# Patient Record
Sex: Female | Born: 1935 | Race: White | Hispanic: No | Marital: Married | Smoking: Never smoker
Health system: Southern US, Community
[De-identification: ages and names within clinical notes are randomized; demographics above are authoritative.]

## PROBLEM LIST (undated history)

## (undated) DIAGNOSIS — R32 Unspecified urinary incontinence: Secondary | ICD-10-CM

## (undated) DIAGNOSIS — E7800 Pure hypercholesterolemia, unspecified: Secondary | ICD-10-CM

## (undated) DIAGNOSIS — Z5189 Encounter for other specified aftercare: Secondary | ICD-10-CM

## (undated) DIAGNOSIS — K589 Irritable bowel syndrome without diarrhea: Secondary | ICD-10-CM

## (undated) DIAGNOSIS — M549 Dorsalgia, unspecified: Secondary | ICD-10-CM

## (undated) DIAGNOSIS — F419 Anxiety disorder, unspecified: Secondary | ICD-10-CM

## (undated) DIAGNOSIS — J449 Chronic obstructive pulmonary disease, unspecified: Secondary | ICD-10-CM

## (undated) DIAGNOSIS — K219 Gastro-esophageal reflux disease without esophagitis: Secondary | ICD-10-CM

## (undated) DIAGNOSIS — T7840XA Allergy, unspecified, initial encounter: Secondary | ICD-10-CM

## (undated) HISTORY — DX: Allergy, unspecified, initial encounter: T78.40XA

## (undated) HISTORY — DX: Chronic obstructive pulmonary disease, unspecified: J44.9

## (undated) HISTORY — DX: Unspecified urinary incontinence: R32

## (undated) HISTORY — PX: BLADDER REPAIR: SHX76

## (undated) HISTORY — DX: Gastro-esophageal reflux disease without esophagitis: K21.9

## (undated) HISTORY — PX: BREAST EXCISIONAL BIOPSY: SUR124

## (undated) HISTORY — PX: CHOLECYSTECTOMY: SHX55

## (undated) HISTORY — DX: Pure hypercholesterolemia, unspecified: E78.00

## (undated) HISTORY — PX: ABDOMINAL HYSTERECTOMY: SUR658

## (undated) HISTORY — DX: Irritable bowel syndrome without diarrhea: K58.9

## (undated) HISTORY — DX: Anxiety disorder, unspecified: F41.9

## (undated) HISTORY — DX: Encounter for other specified aftercare: Z51.89

## (undated) HISTORY — DX: Dorsalgia, unspecified: M54.9

---

## 1997-11-17 ENCOUNTER — Ambulatory Visit (HOSPITAL_COMMUNITY): Admission: RE | Admit: 1997-11-17 | Discharge: 1997-11-17 | Payer: Self-pay | Admitting: Gastroenterology

## 1998-01-03 ENCOUNTER — Ambulatory Visit (HOSPITAL_COMMUNITY): Admission: RE | Admit: 1998-01-03 | Discharge: 1998-01-03 | Payer: Self-pay | Admitting: Obstetrics & Gynecology

## 1998-06-21 ENCOUNTER — Observation Stay (HOSPITAL_COMMUNITY): Admission: RE | Admit: 1998-06-21 | Discharge: 1998-06-24 | Payer: Self-pay | Admitting: Urology

## 2000-01-16 ENCOUNTER — Encounter: Admission: RE | Admit: 2000-01-16 | Discharge: 2000-01-16 | Payer: Self-pay | Admitting: Geriatric Medicine

## 2000-01-16 ENCOUNTER — Encounter: Payer: Self-pay | Admitting: Geriatric Medicine

## 2000-02-12 ENCOUNTER — Encounter: Payer: Self-pay | Admitting: Geriatric Medicine

## 2000-02-12 ENCOUNTER — Encounter: Admission: RE | Admit: 2000-02-12 | Discharge: 2000-02-12 | Payer: Self-pay | Admitting: Geriatric Medicine

## 2000-02-18 ENCOUNTER — Encounter (INDEPENDENT_AMBULATORY_CARE_PROVIDER_SITE_OTHER): Payer: Self-pay | Admitting: *Deleted

## 2000-02-18 ENCOUNTER — Ambulatory Visit (HOSPITAL_BASED_OUTPATIENT_CLINIC_OR_DEPARTMENT_OTHER): Admission: RE | Admit: 2000-02-18 | Discharge: 2000-02-18 | Payer: Self-pay | Admitting: *Deleted

## 2001-11-26 ENCOUNTER — Encounter: Payer: Self-pay | Admitting: Geriatric Medicine

## 2001-11-26 ENCOUNTER — Encounter: Admission: RE | Admit: 2001-11-26 | Discharge: 2001-11-26 | Payer: Self-pay | Admitting: Geriatric Medicine

## 2002-06-13 ENCOUNTER — Other Ambulatory Visit: Admission: RE | Admit: 2002-06-13 | Discharge: 2002-06-13 | Payer: Self-pay | Admitting: Geriatric Medicine

## 2002-07-14 ENCOUNTER — Observation Stay (HOSPITAL_COMMUNITY): Admission: RE | Admit: 2002-07-14 | Discharge: 2002-07-15 | Payer: Self-pay | Admitting: Urology

## 2002-07-14 ENCOUNTER — Encounter: Payer: Self-pay | Admitting: Urology

## 2002-11-17 ENCOUNTER — Ambulatory Visit (HOSPITAL_BASED_OUTPATIENT_CLINIC_OR_DEPARTMENT_OTHER): Admission: RE | Admit: 2002-11-17 | Discharge: 2002-11-17 | Payer: Self-pay | Admitting: Urology

## 2002-12-06 ENCOUNTER — Encounter: Admission: RE | Admit: 2002-12-06 | Discharge: 2002-12-06 | Payer: Self-pay | Admitting: Geriatric Medicine

## 2002-12-06 ENCOUNTER — Encounter: Payer: Self-pay | Admitting: Geriatric Medicine

## 2003-12-07 ENCOUNTER — Encounter: Admission: RE | Admit: 2003-12-07 | Discharge: 2003-12-07 | Payer: Self-pay | Admitting: Geriatric Medicine

## 2003-12-14 ENCOUNTER — Encounter: Admission: RE | Admit: 2003-12-14 | Discharge: 2003-12-14 | Payer: Self-pay | Admitting: Geriatric Medicine

## 2004-05-27 ENCOUNTER — Ambulatory Visit (HOSPITAL_BASED_OUTPATIENT_CLINIC_OR_DEPARTMENT_OTHER): Admission: RE | Admit: 2004-05-27 | Discharge: 2004-05-27 | Payer: Self-pay | Admitting: Urology

## 2005-02-04 ENCOUNTER — Encounter: Admission: RE | Admit: 2005-02-04 | Discharge: 2005-02-04 | Payer: Self-pay

## 2005-02-18 ENCOUNTER — Encounter: Admission: RE | Admit: 2005-02-18 | Discharge: 2005-02-18 | Payer: Self-pay

## 2006-02-05 ENCOUNTER — Encounter: Admission: RE | Admit: 2006-02-05 | Discharge: 2006-02-05 | Payer: Self-pay | Admitting: Family Medicine

## 2006-02-05 IMAGING — MG MM SCREEN MAMMOGRAM BILATERAL
5 series · 5 of 5 positions shown · non-contrast
Comparison: none

DG SCREEN MAMMOGRAM BILATERAL
Bilateral CC and MLO view(s) were taken.

DIGITAL SCREENING MAMMOGRAM WITH CAD:
There is a fibroglandular pattern.  A possible mass is noted in the left breast.  Spot compression 
views and possibly sonography are recommended for further evaluation.  In the right breast, no 
masses or malignant type calcifications are identified.  Compared with prior studies.

[R CC]
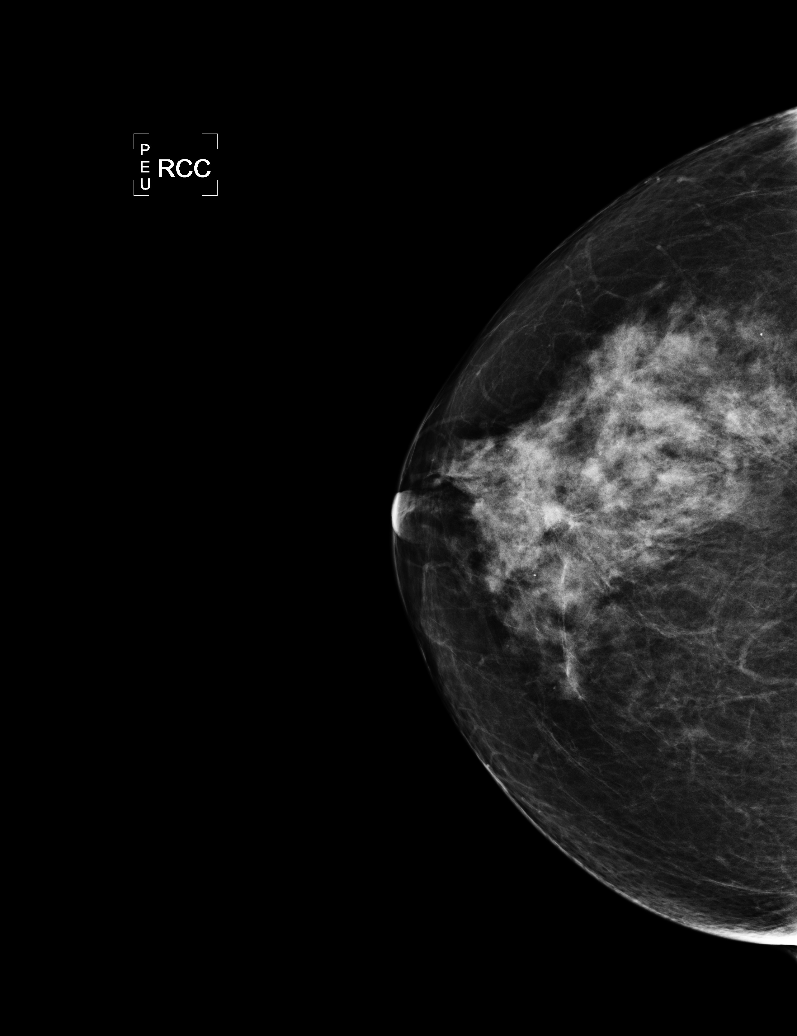

[L CC]
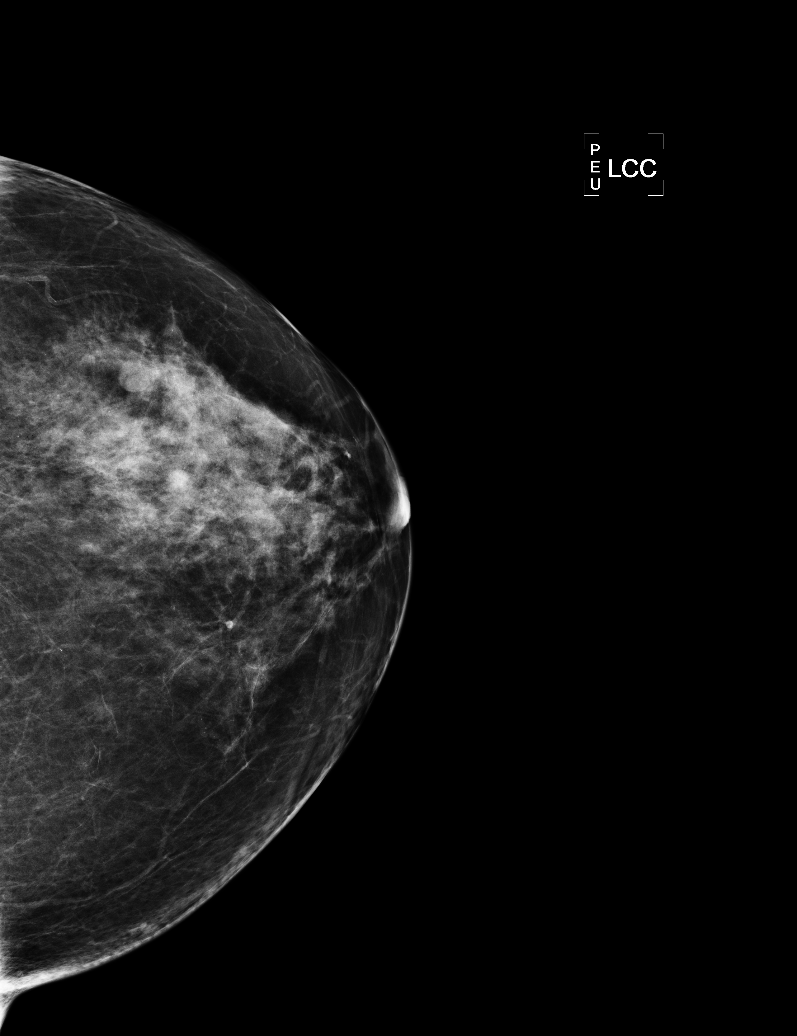

[L MLO]
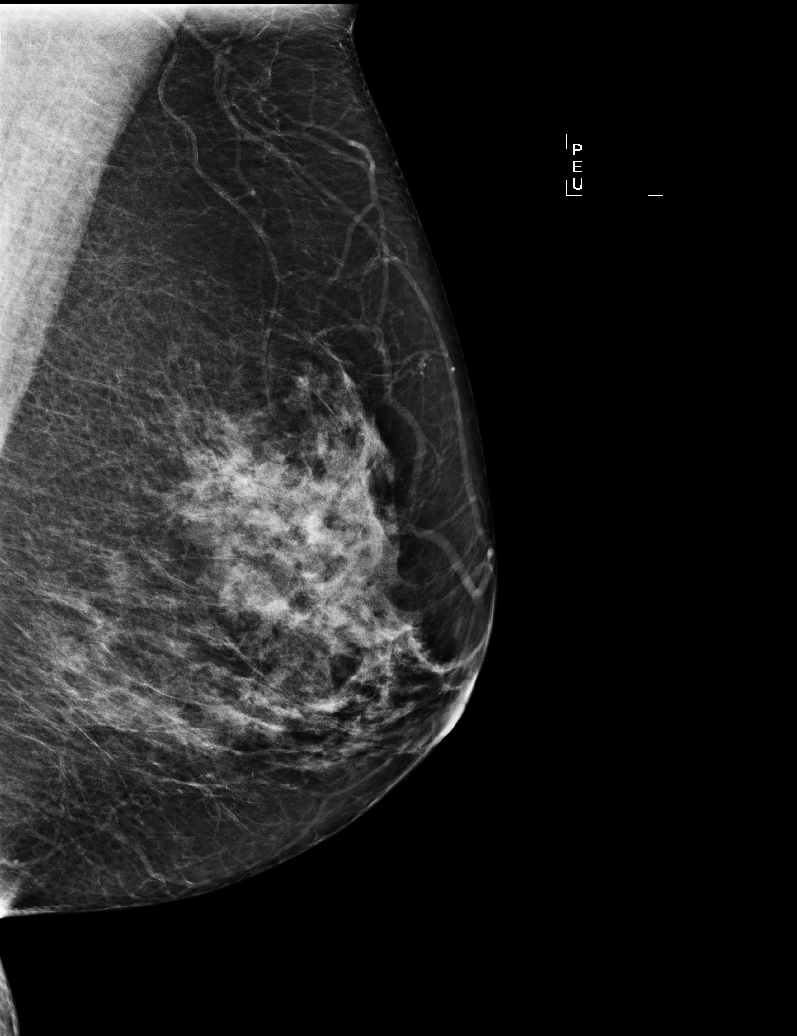

[R MLO]
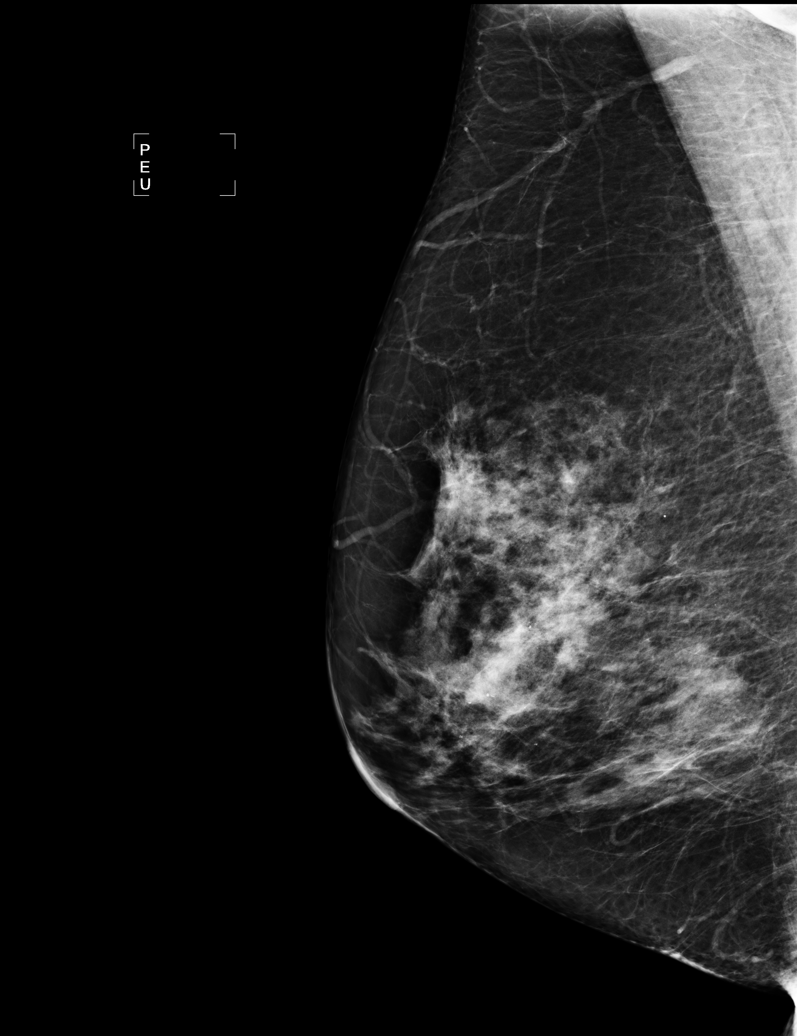

[L XCCL]
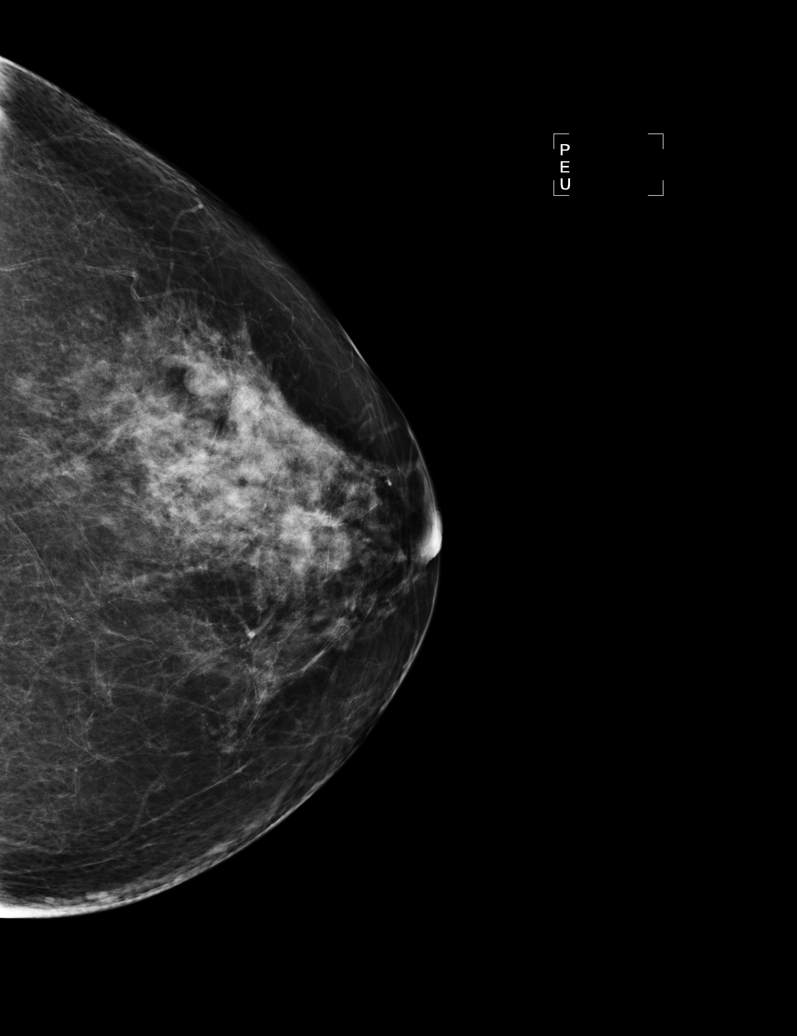

[5 of 5 positions shown; findings below may reference images not displayed]

IMPRESSION: Possible mass, left breast.  Additional evaluation is indicated.  The patient will be contacted for
additional studies and a supplementary report will follow.  No specific mammographic evidence of 
malignancy, right breast.

ASSESSMENT: Need additional imaging evaluation and/or prior mammograms for comparison - BI-RADS 0

Further imaging of the left breast.
ANALYZED BY COMPUTER AIDED DETECTION. , THIS PROCEDURE WAS A DIGITAL MAMMOGRAM.

## 2006-02-16 ENCOUNTER — Encounter: Admission: RE | Admit: 2006-02-16 | Discharge: 2006-02-16 | Payer: Self-pay | Admitting: Family Medicine

## 2006-02-16 IMAGING — US UNKNOWN PR STUDY
1 series · 3 of 3 positions shown · non-contrast
Comparison: none

[REDACTED] LEFT
CC and MLO view(s) were taken of the left breast.

LEFT BREAST ULTRASOUND
DIGITAL LIMITED LEFT DIAGNOSTIC MAMMOGRAM AND LEFT BREAST ULTRASOUND:
CLINICAL DATA: Abnormal screening mammogram.

[Series 1: unknown pr study · 3 of 3 slices shown]
[im 1/3]
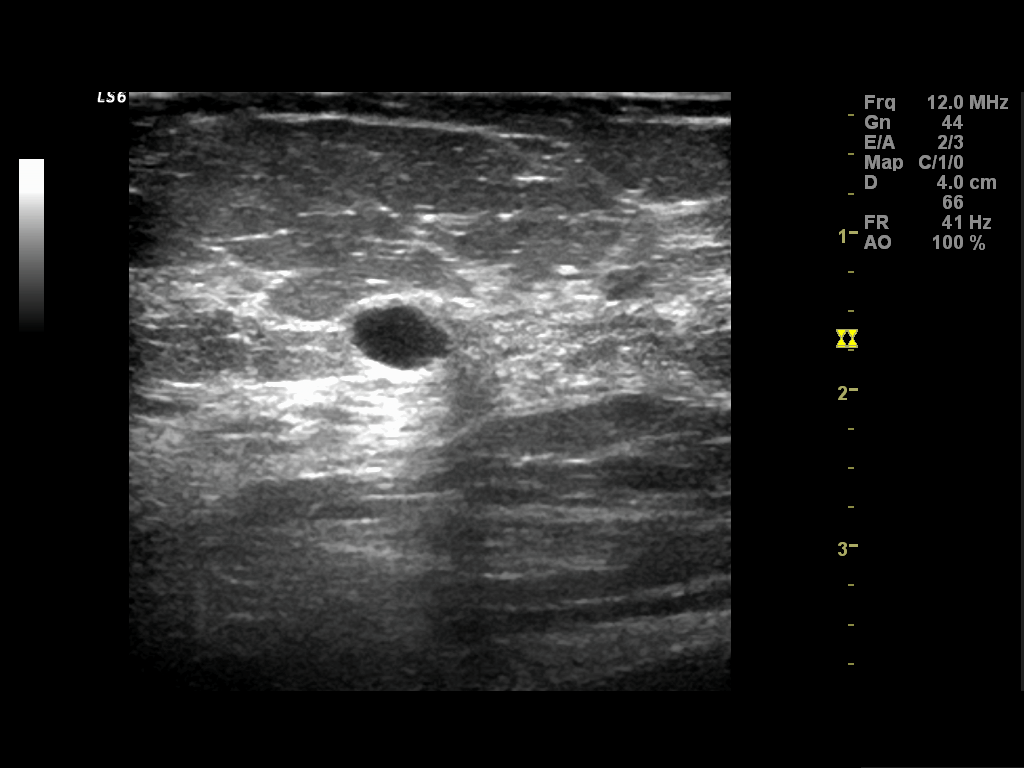
[im 2/3]
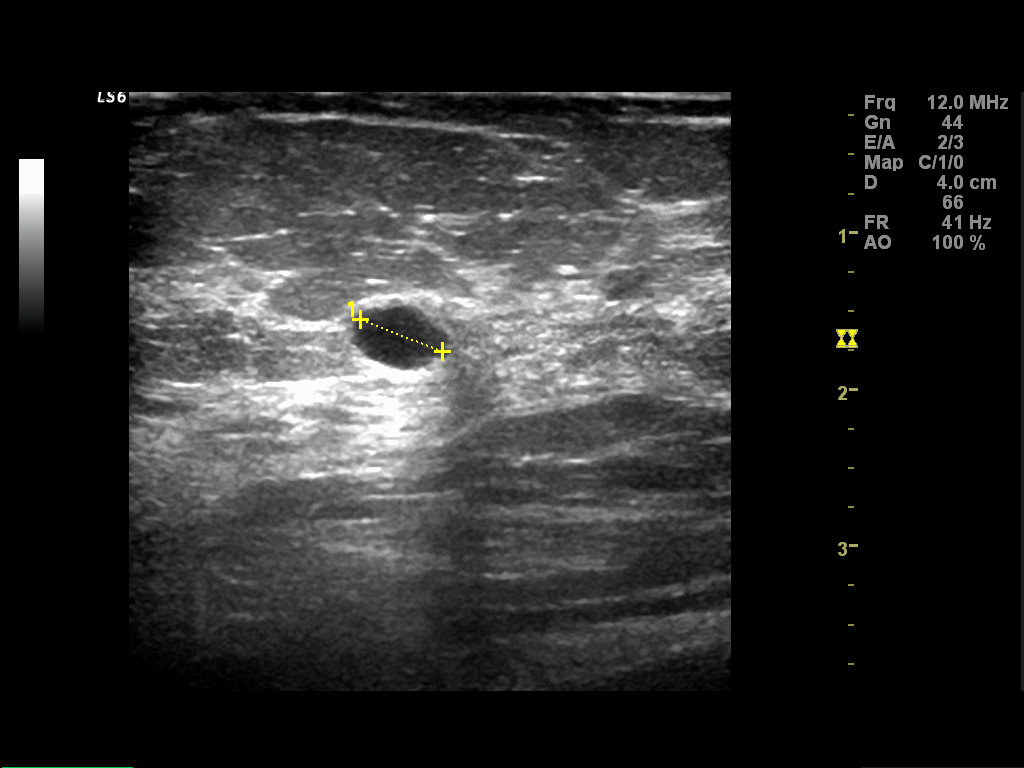
[im 3/3]
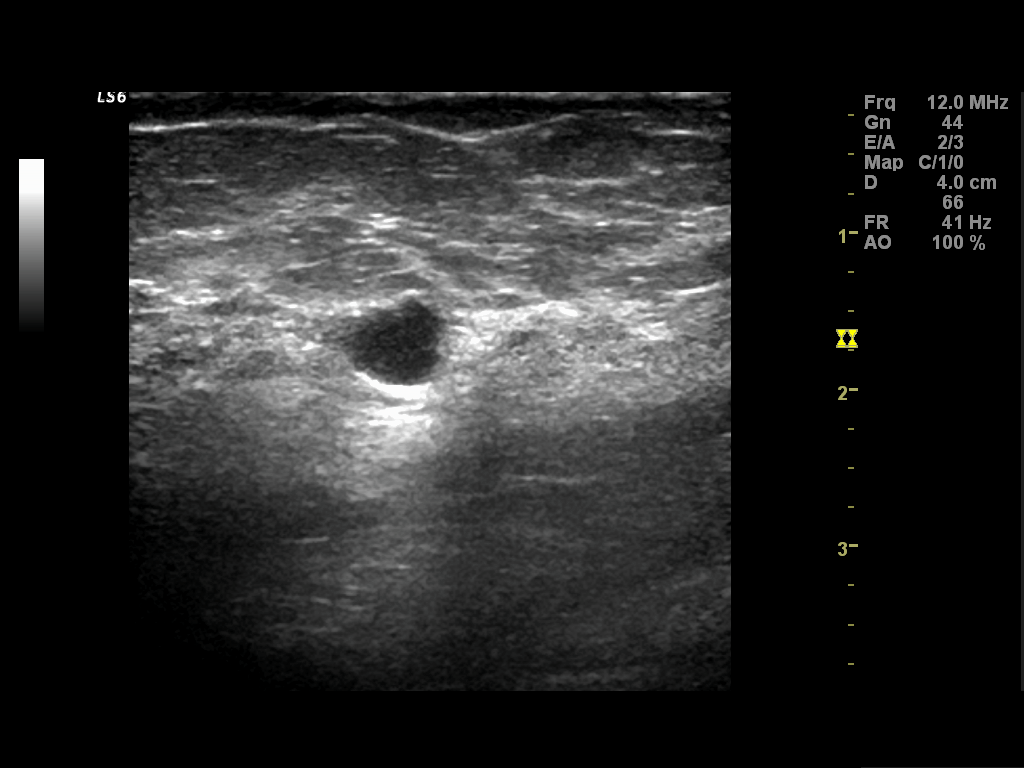

[3 of 3 positions shown; findings below may reference images not displayed]

Comparison study is [DATE].  Additional views reveal persistence of a partially obscured isodense 
mass within the left upper outer quadrant.

Physical exam of the left breast is normal.  Ultrasound reveals a 6 mm simple cyst at the 2 o'clock
position, 3 cm from the nipple which correlates with the area of mammographic concern.  No 
suspicious solid masses or shadowing are seen.
IMPRESSION: Simple cyst in the left upper outer quadrant.  There is no specific radiographic evidence of 
malignancy on the left.  Screening mammogram in one year is recommended.

ASSESSMENT: Benign - BI-RADS 2

Screening mammogram in 1 year.
,

## 2007-04-13 ENCOUNTER — Encounter: Admission: RE | Admit: 2007-04-13 | Discharge: 2007-04-13 | Payer: Self-pay | Admitting: Family Medicine

## 2007-04-13 IMAGING — MG MM SCREEN MAMMOGRAM BILATERAL
5 series · 5 of 5 positions shown · non-contrast
Comparison: Prior studies.

DG SCREEN MAMMOGRAM BILATERAL
Bilateral CC and MLO view(s) were taken.
Prior study comparison: [DATE], bilateral screening mammogram.

DIGITAL SCREENING MAMMOGRAM WITH CAD:

[R CC]
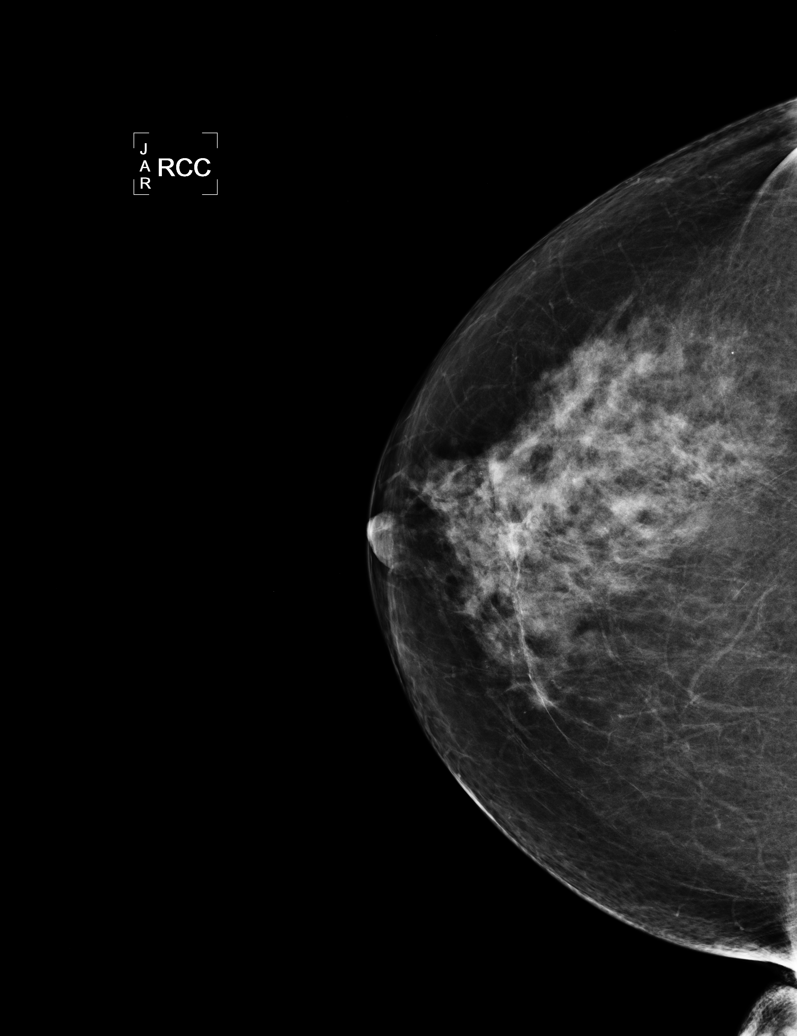

[L CC]
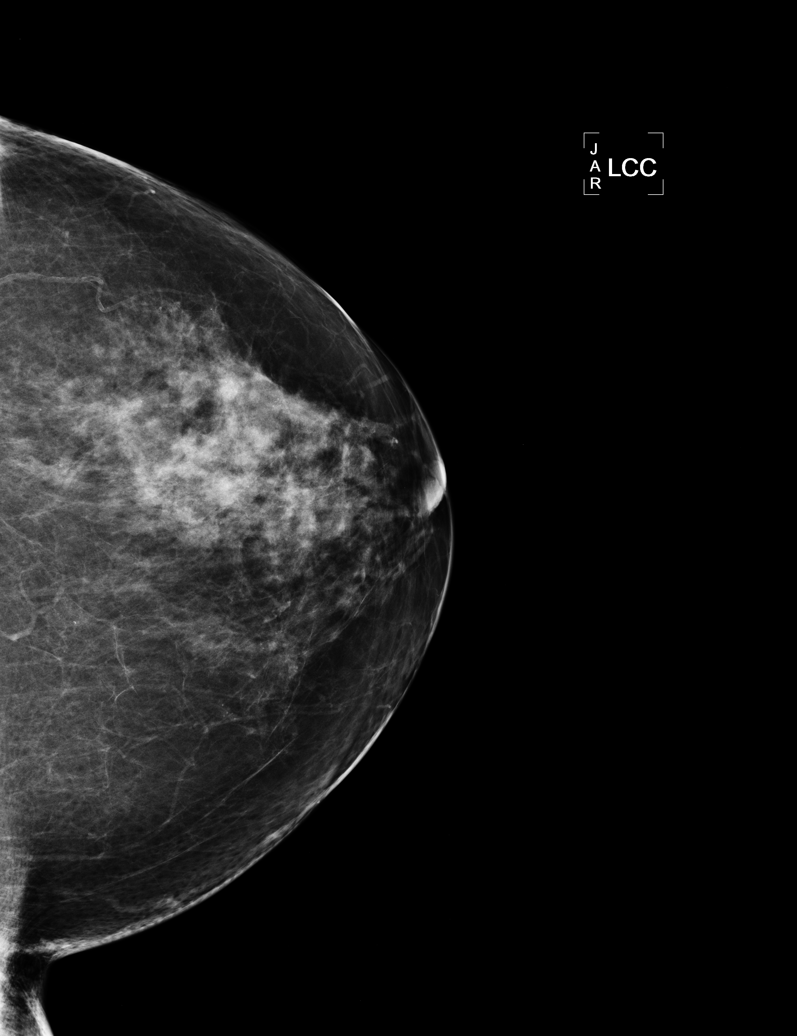

[L MLO (1 of 2)]
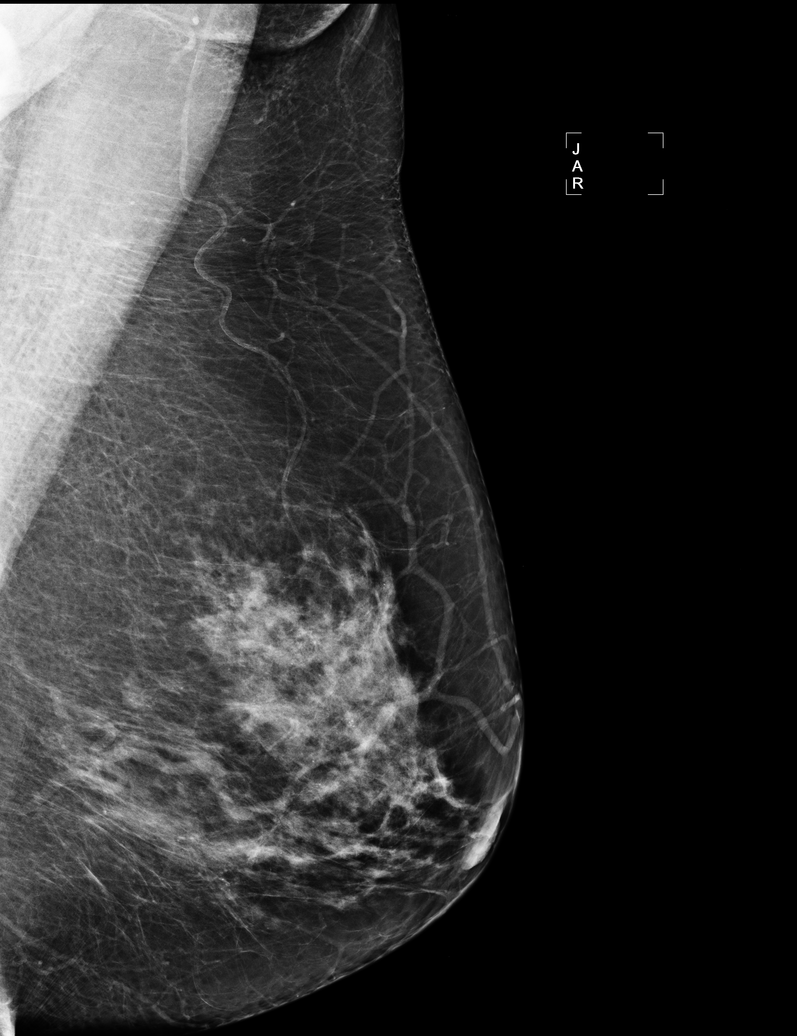

[R MLO]
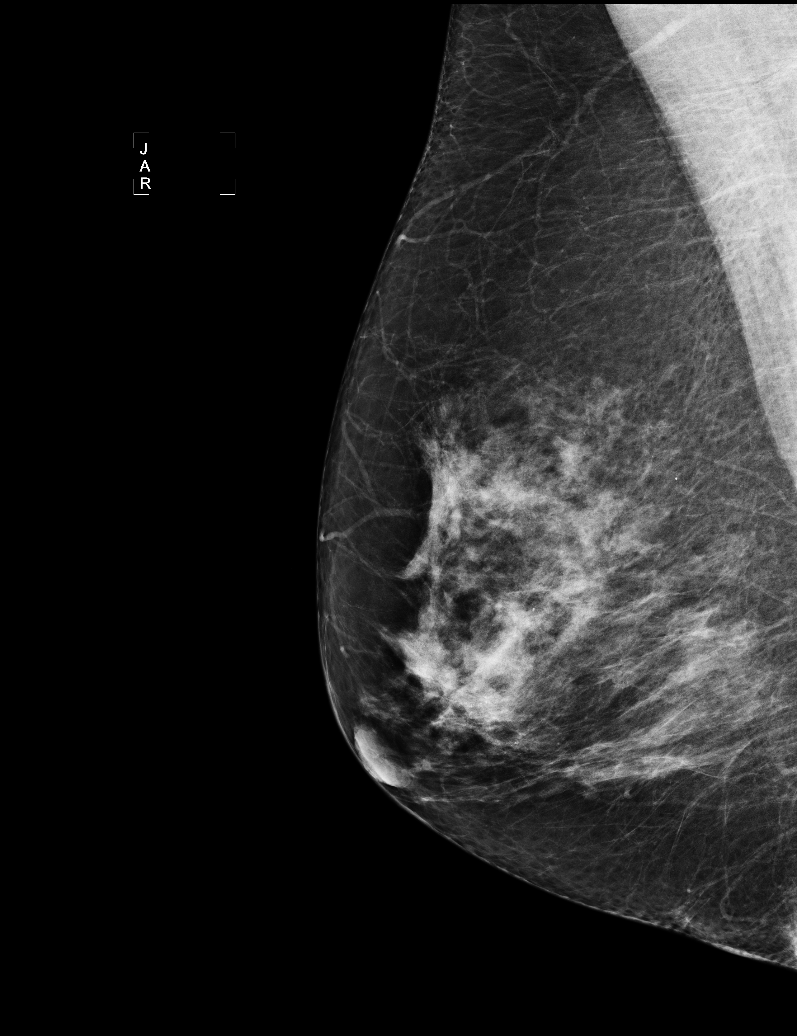

[L MLO (2 of 2)]
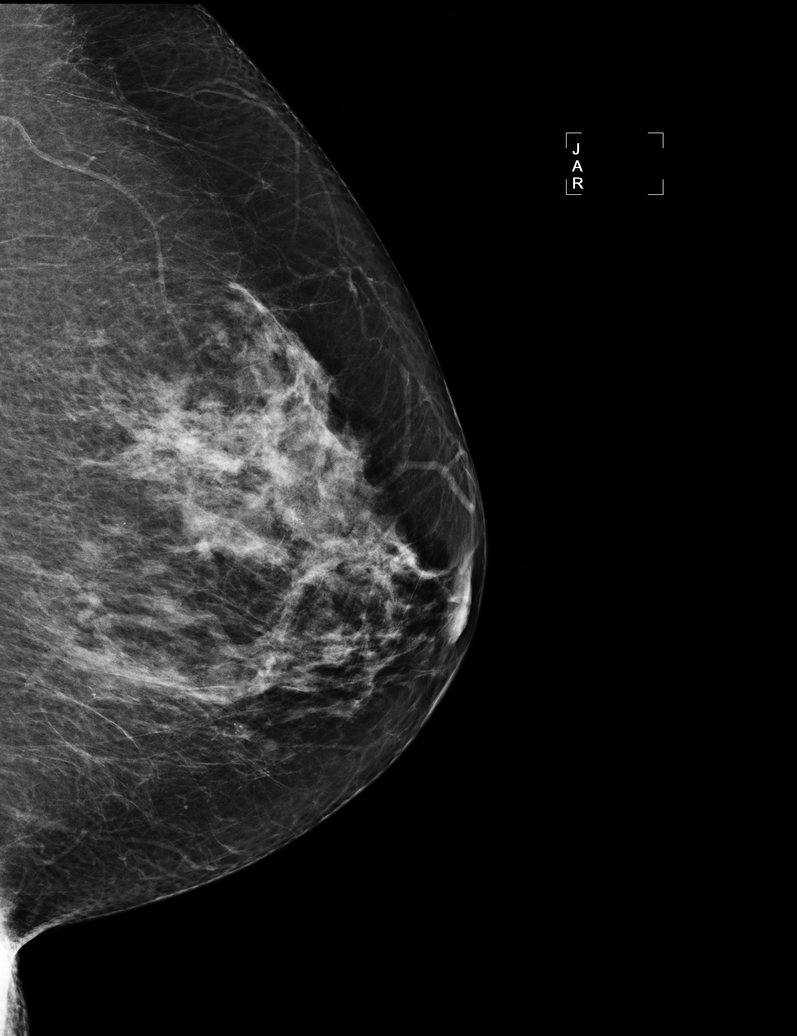

[5 of 5 positions shown; findings below may reference images not displayed]

The breast tissue is heterogeneously dense.  There is no dominant mass, architectural distortion or
calcification to suggest malignancy.
IMPRESSION: No mammographic evidence of malignancy.  Suggest yearly screening mammography.

ASSESSMENT: Negative - BI-RADS 1

Screening mammogram in 1 year.
ANALYZED BY COMPUTER AIDED DETECTION. , THIS PROCEDURE WAS A DIGITAL MAMMOGRAM.

## 2008-05-02 ENCOUNTER — Encounter: Admission: RE | Admit: 2008-05-02 | Discharge: 2008-05-02 | Payer: Self-pay | Admitting: Family Medicine

## 2008-05-02 IMAGING — MG MM SCREEN MAMMOGRAM BILATERAL
4 series · 4 of 4 positions shown · non-contrast
Comparison: none

DG SCREEN MAMMOGRAM BILATERAL
Bilateral CC and MLO view(s) were taken.

DIGITAL SCREENING MAMMOGRAM WITH CAD:
The breast tissue is extremely dense.  No masses or malignant type calcifications are identified.  
Compared with prior studies.

[R CC]
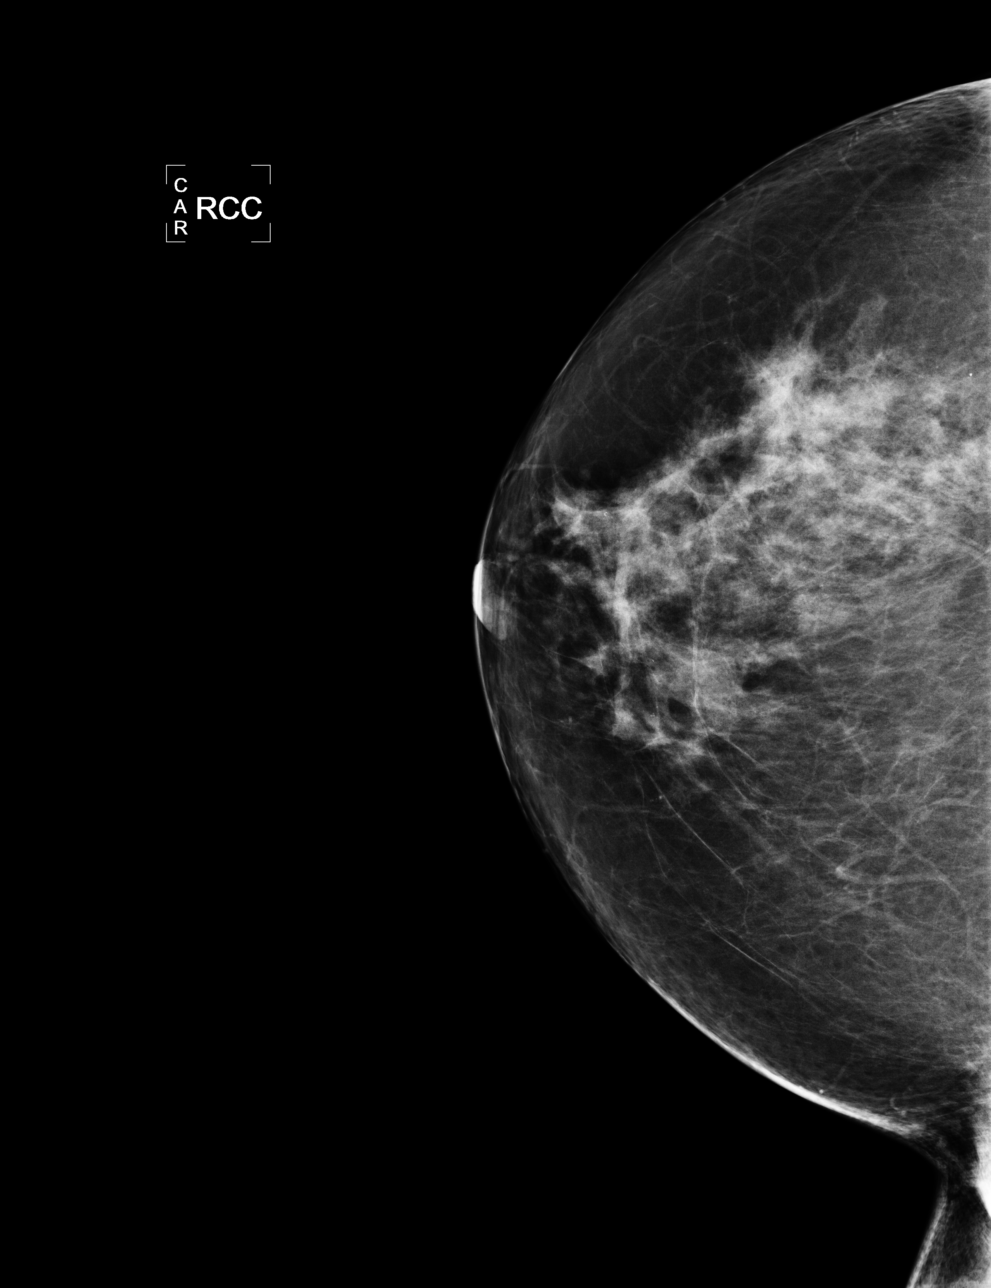

[L CC]
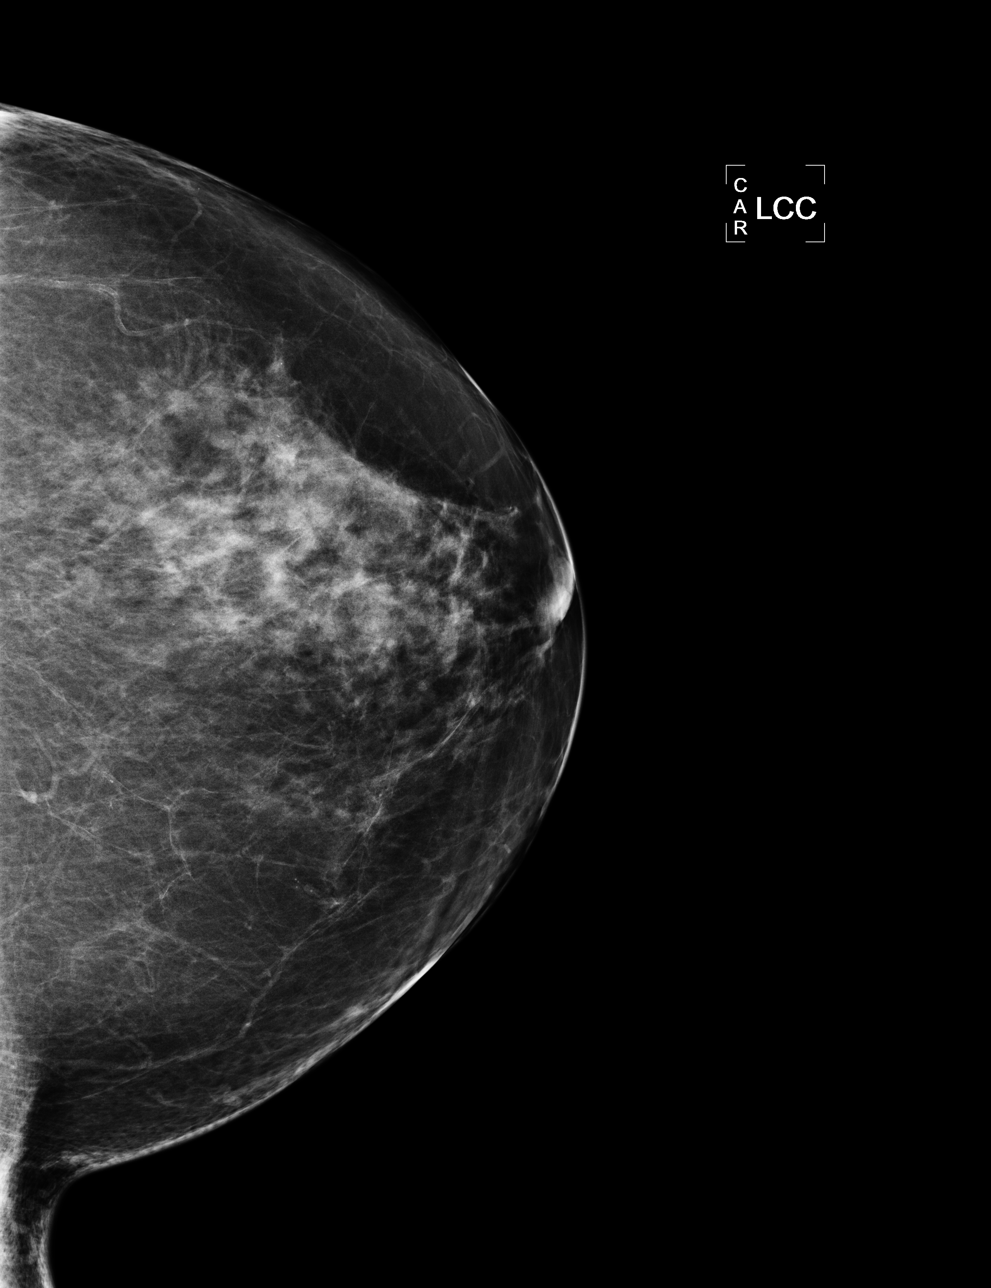

[L MLO]
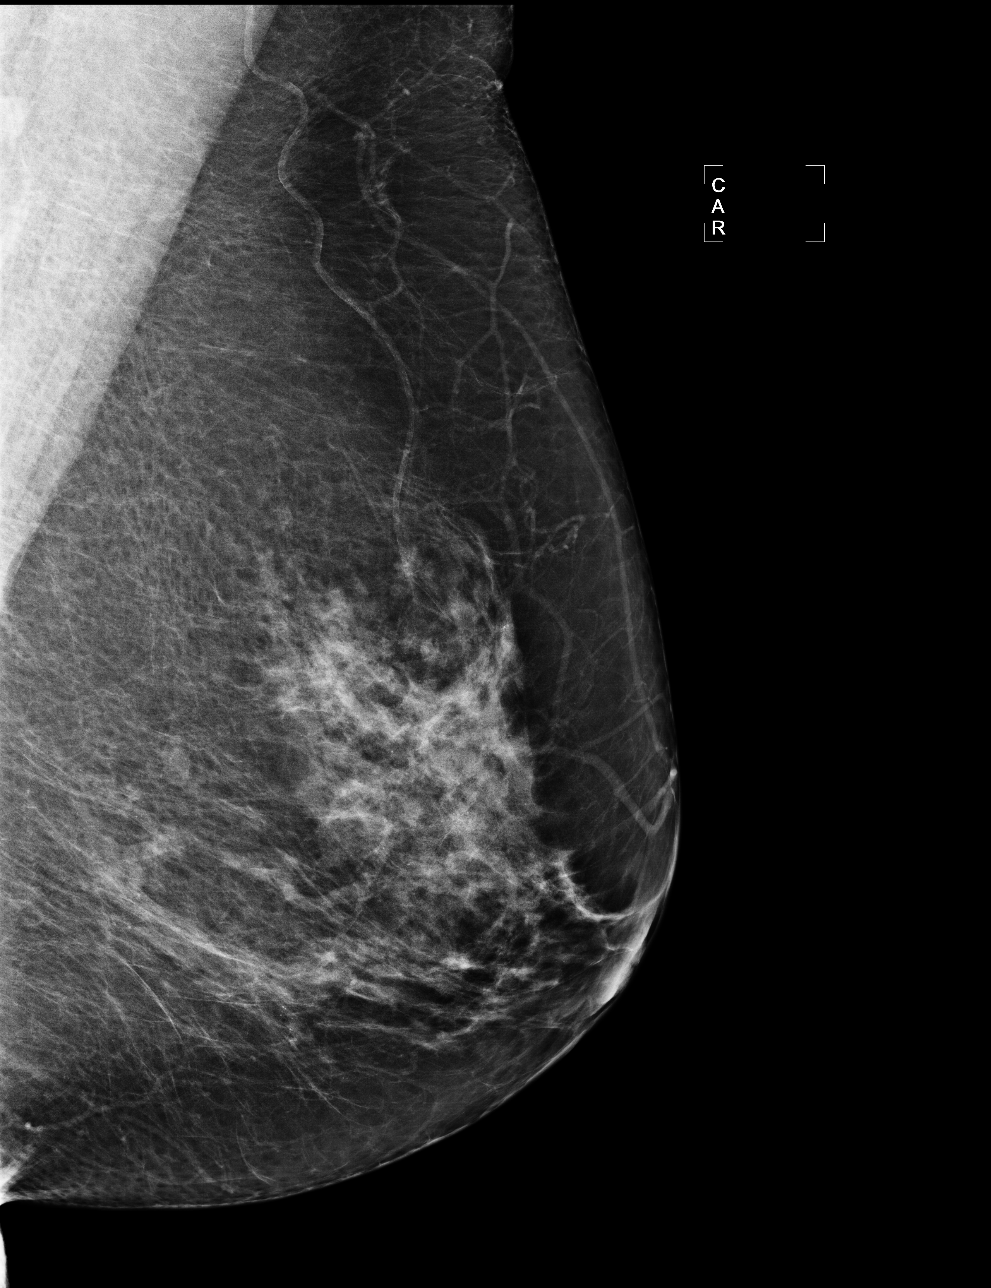

[R MLO]
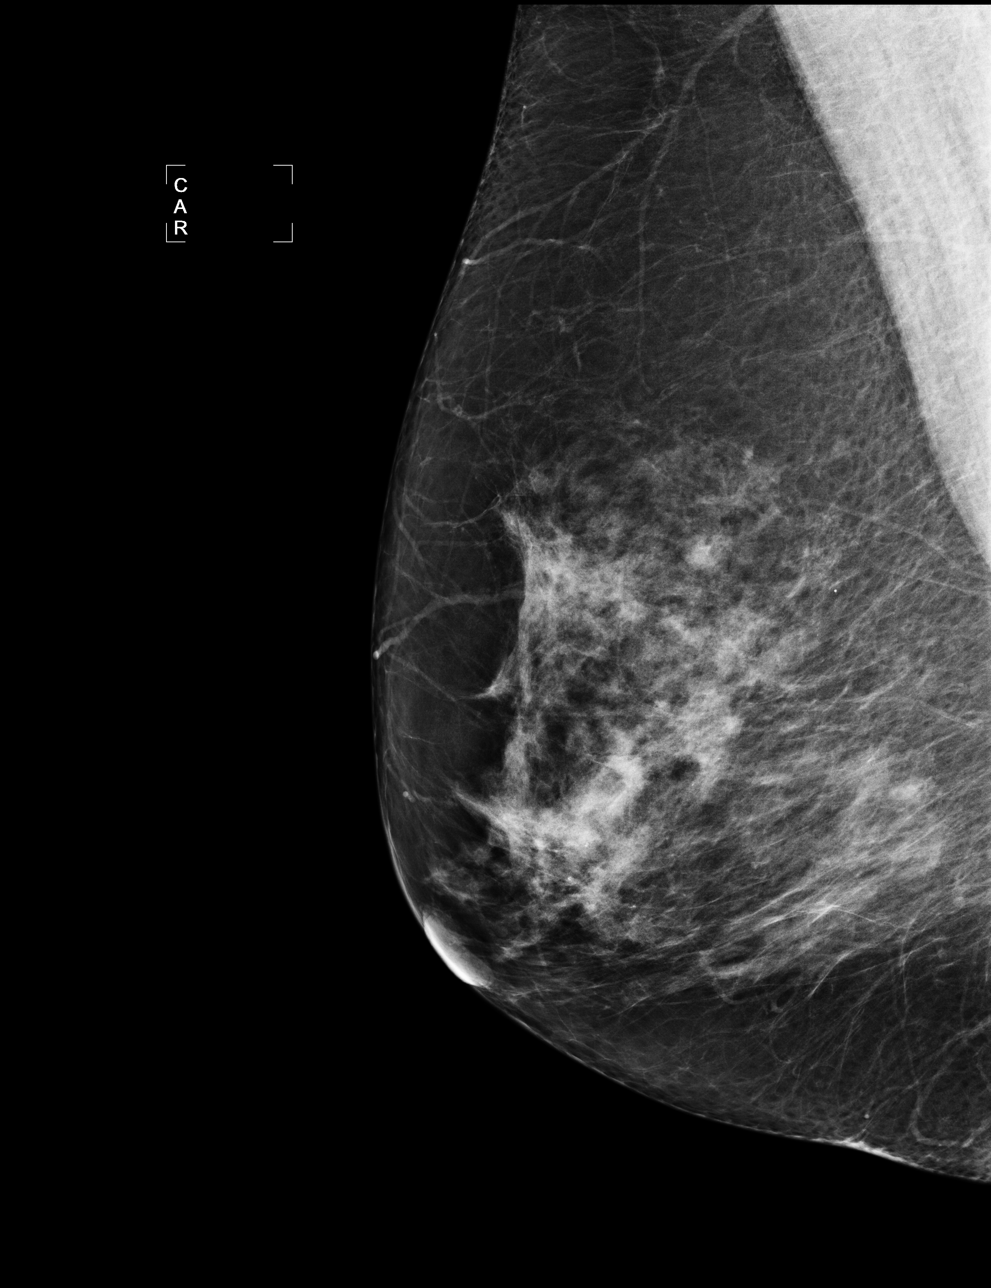

[4 of 4 positions shown; findings below may reference images not displayed]

IMPRESSION: No specific mammographic evidence of malignancy.  Next screening mammogram is recommended in one 
year.

ASSESSMENT: Negative - BI-RADS 1

Screening mammogram in 1 year.
ANALYZED BY COMPUTER AIDED DETECTION. , THIS PROCEDURE WAS A DIGITAL MAMMOGRAM.

## 2009-05-09 ENCOUNTER — Encounter: Admission: RE | Admit: 2009-05-09 | Discharge: 2009-05-09 | Payer: Self-pay | Admitting: Family Medicine

## 2009-05-09 IMAGING — MG MM DIGITAL SCREENING
4 series · 4 of 4 positions shown · non-contrast
Comparison: Prior studies.

DG SCREEN MAMMOGRAM BILATERAL
Bilateral CC and MLO view(s) were taken.

DIGITAL SCREENING MAMMOGRAM WITH CAD:

[R CC]
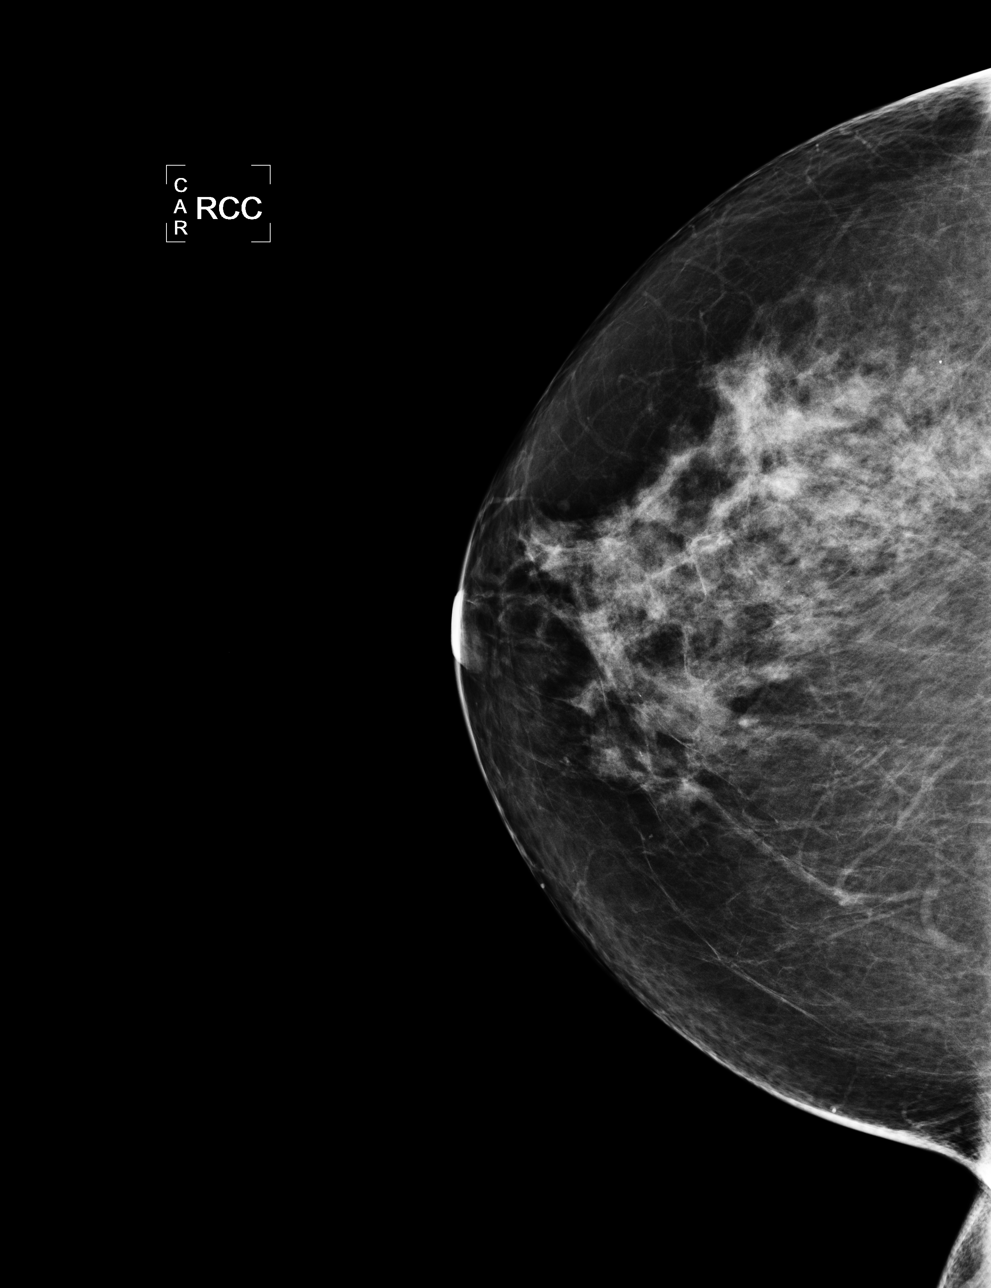

[L CC]
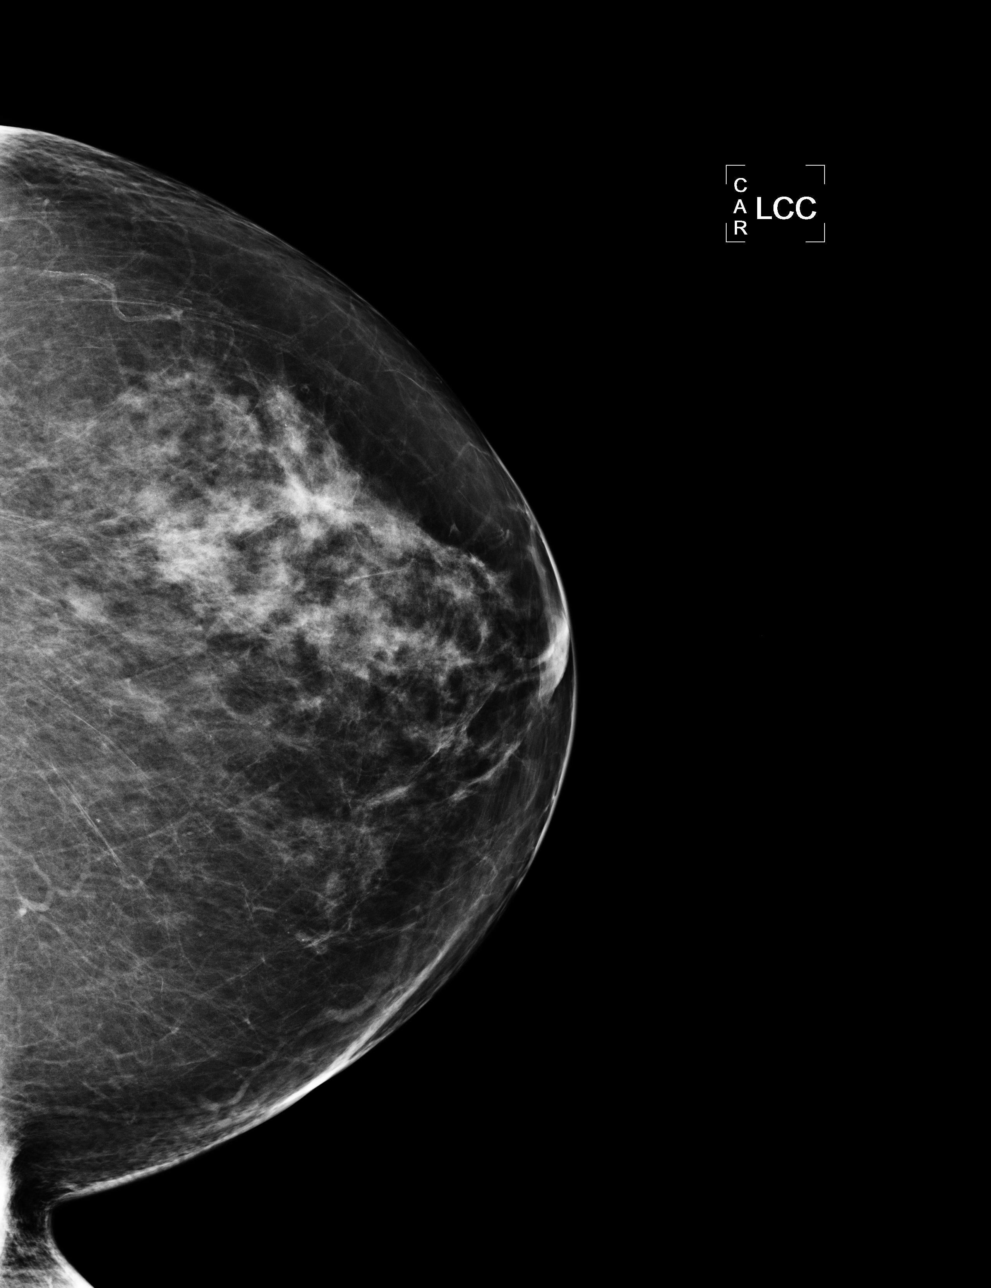

[L MLO]
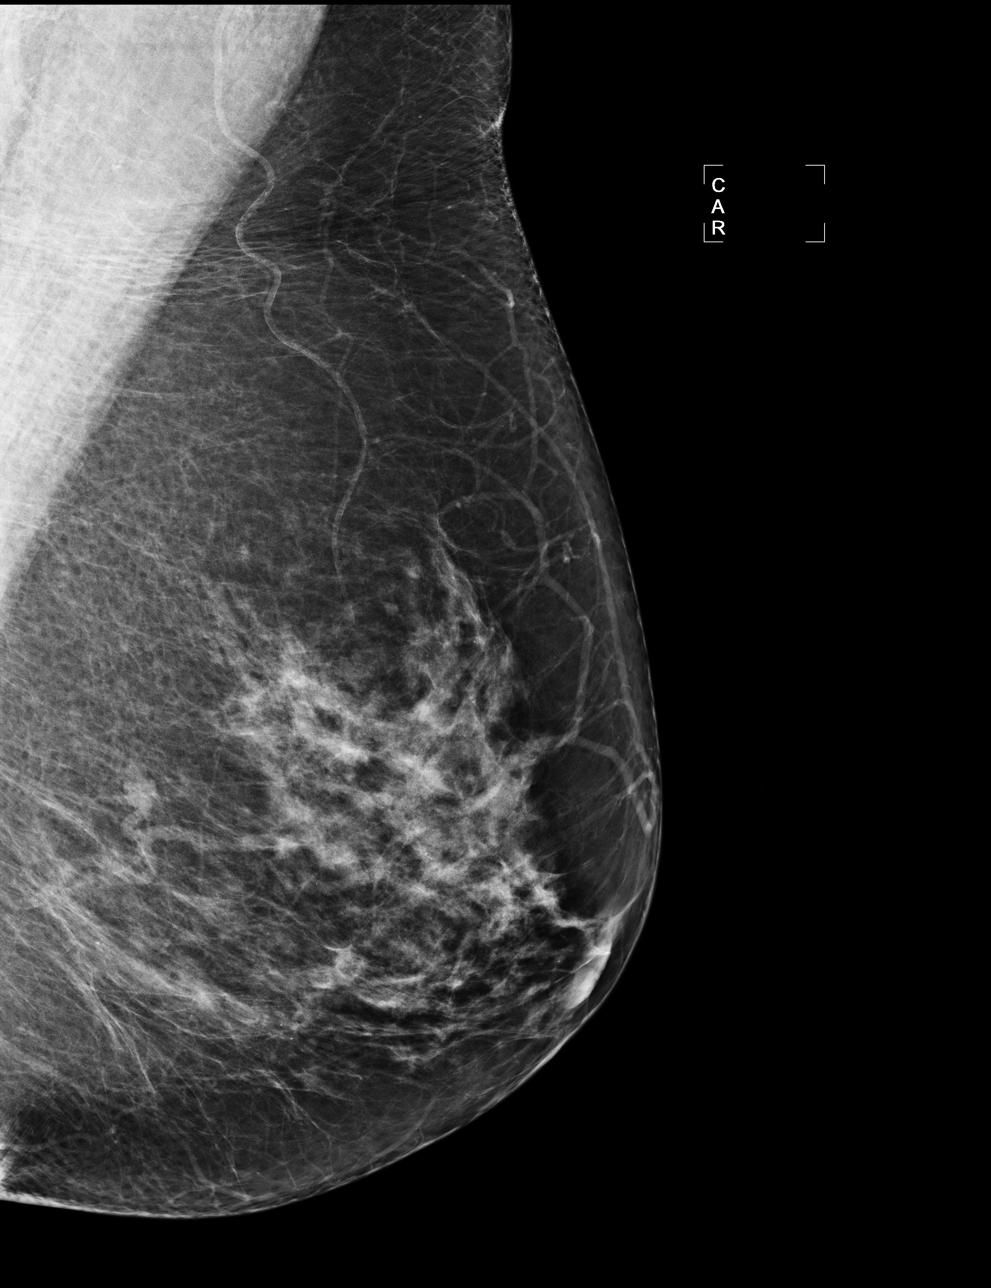

[R MLO]
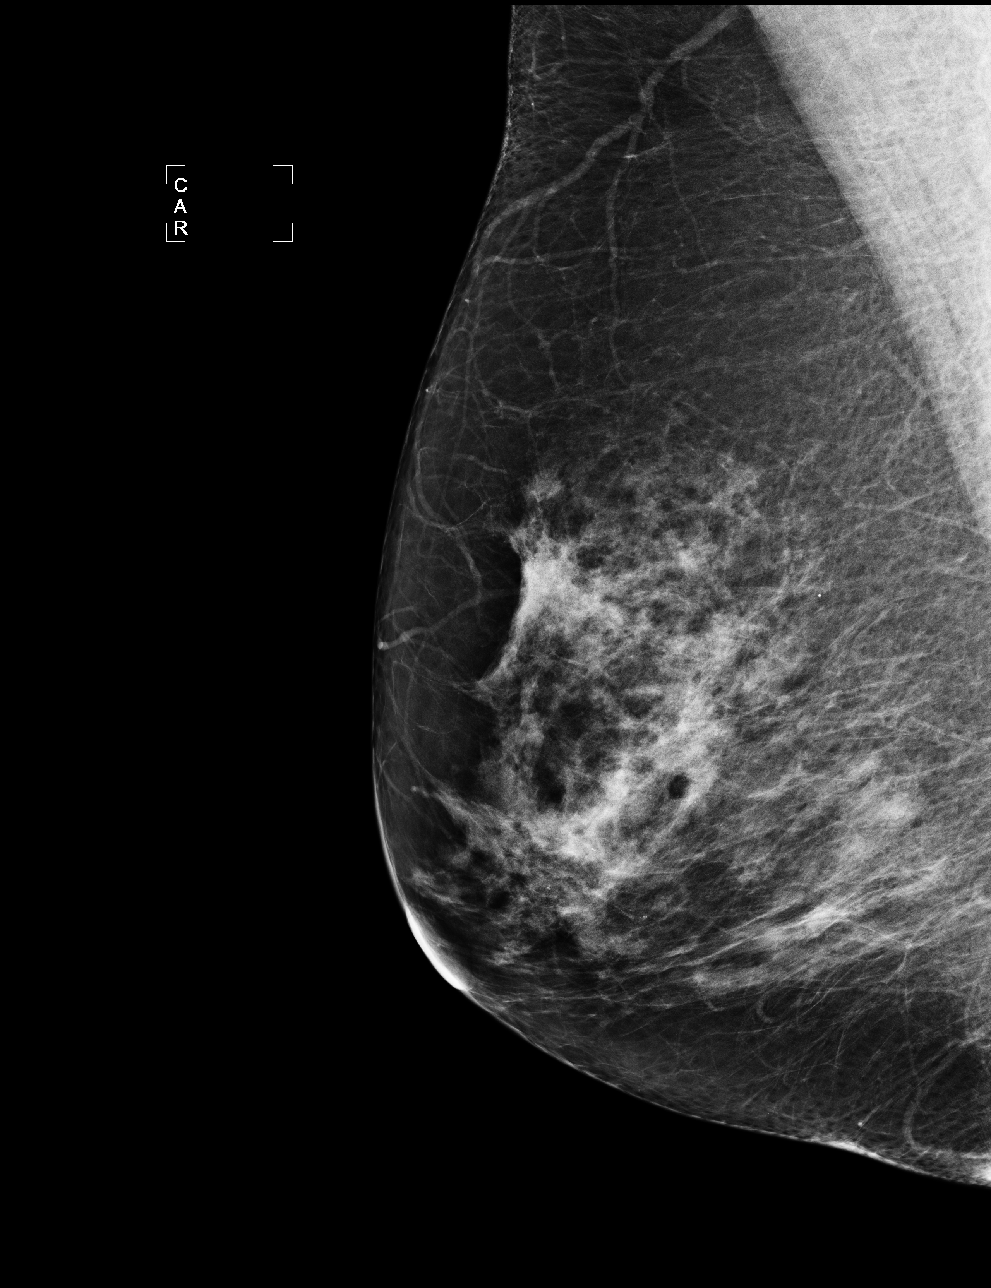

[4 of 4 positions shown; findings below may reference images not displayed]

There are scattered fibroglandular densities.  There is no dominant mass, architectural distortion 
or calcification to suggest malignancy.

Images were processed with CAD.
IMPRESSION: No mammographic evidence of malignancy.  Suggest yearly screening mammography.

A result letter of this screening mammogram will be mailed directly to the patient.

ASSESSMENT: Negative - BI-RADS 1

Screening mammogram in 1 year.
,

## 2010-05-12 ENCOUNTER — Encounter: Payer: Self-pay | Admitting: Geriatric Medicine

## 2010-09-06 NOTE — Op Note (Signed)
NAME:  [name], Tina            ACCOUNT NO.:  [id number]   MEDICAL RECORD NO.:  [id number]          PATIENT TYPE:  AMB   LOCATION:  [facility]                         FACILITY:  [facility]   PHYSICIAN:  Sigmund I. Patsi Sears, M.D.DATE OF BIRTH:  Sep 18, 1934   DATE OF PROCEDURE:  05/27/2004  DATE OF DISCHARGE:                                 OPERATIVE REPORT   PREOPERATIVE DIAGNOSIS:  Vaginal extrusion of Mentor OB anti-incontinence  pubovaginal sling tape.   POSTOPERATIVE DIAGNOSIS:  Vaginal extrusion of Mentor OB anti-incontinence  pubovaginal sling tape.   PROCEDURE:  Transvaginal repair of sling extrusion.   SURGEON:  Sigmund I. Patsi Sears, M.D.   ANESTHESIA:  General LMA.   PREPARATION:  After appropriate preanesthesia, the patient was brought to  the operating room and placed on the operating table in the dorsal supine  position where general LMA anesthesia was introduced.  She was then replaced  in the dorsal lithotomy position.  She was given a B&O suppository.  The  pubis was prepped with Betadine solution and draped in the usual fashion.   INDICATIONS FOR PROCEDURE:  This 76 year old female is status post Mentor  transobturator OB tape sling for urinary incontinence in March of 2004.  The  patient had a mild extrusion which was repaired, but she has now returned  with a deeper right-sided extrusion and is for re-repair.   DESCRIPTION OF PROCEDURE:  Outline of area of extrusion was made in the  right paraurethral tissue.  A U-shaped incision was then made in the vagina  in the anterior vaginal wall which was noted to have a grade 1 cystocele.  The subcutaneous tissue was dissected, and a rectangular piece of  transvaginal tissue was removed, including a portion of extruded pubovaginal  sling.  A mucosal biofilm was identified, and this was teased and dissected  from the tissue.  Multiple irrigations with antibiotic irrigation was  accomplished.  The area was then clean, with  mild bleeding.  Because of the  thinness of the vaginal tissue, it was elected to place a piece of PelviSoft  in the wound.  Therefore, a piece of PelviSoft was soaked in antibiotic  solution and contoured to fit the wound.  Four separate sutures were then  used to hold the PelviSoft in place in each corner.  Following placement of  the PelviSoft, the vaginal wound was closed with interrupted 3-0 Vicryl  sutures, with excellent coverage and excellent closure.  Because of piece of  PelviSoft was used, it was elected to place packing and Foley catheter in  order to push the  PelviSoft up and have it lay flat against the bladder wall.  Cystoscopy was  then accomplished and showed that the bladder was in excellent shape.  It  was elected to leave the Foley catheter and packing in place for 24 hours.   The patient was awakened and taken to the recovery room in good condition.      SIT/MEDQ  D:  05/27/2004  T:  05/27/2004  Job:  [id number]

## 2010-09-06 NOTE — Op Note (Signed)
   NAME:  Tina [name], Tina [name]                      ACCOUNT NO.:  [id number]   MEDICAL RECORD NO.:  [id number]                   PATIENT TYPE:  AMB   LOCATION:  DAY                                  FACILITY:  [facility]   PHYSICIAN:  Sigmund I. Patsi Sears, M.D.         DATE OF BIRTH:  03-15-1935   DATE OF PROCEDURE:  07/14/2002  DATE OF DISCHARGE:                                 OPERATIVE REPORT   PREOPERATIVE DIAGNOSIS:  Urinary incontinence.   POSTOPERATIVE DIAGNOSIS:  Urinary incontinence.   OPERATION:  Mentor OB transvaginal sling.   SURGEON:  Sigmund I. Patsi Sears, M.D.   ANESTHESIA:  General (LMA).   PREPARATION:  After appropriate preanesthesia, the patient is brought to the  operating room and placed on the operating table in the dorsal supine  position where general LMA anesthesia was introduced.  She was then re-  placed in the high lithotomy position where the pubis was prepped with  Betadine solution and draped in the usual fashion.   HISTORY:  The patient has a history of a positive Marshall test with a leak  point pressure of 34 cm of water.  Her examination shows that she does not  need an anterior vaginal vault repair.  She is now for a pubovaginal sling.   DESCRIPTION OF PROCEDURE:  Xylocaine and epinephrine, 6 mL, are injected  into the periurethral space in the midline.  A midline incision is then made  over the urethra, measuring approximately 2 cm, and subcutaneous tissue was  dissected bilaterally.  Ruler marking is noted 4 cm lateral to the clitoral  line, and the obturator bone is palpated.  Two marks are made on the other  side of the pubis, and stab wounds are established.  Using the curved hook,  the obturator bone is traversed, and brought out through the midline  incision.  The tape is then brought retrograde bilaterally.  Cystoscopy was  accomplished and shows that the bladder is in normal position.  The urethra  is loose and is reinforced with Tutoplast  fascia (2 x 7).  The wound is then  closed with running 3-0 Vicryl suture.  Sterile dressings are applied, and  the patient is awakened and taken to the recovery room in good condition.                                               Sigmund I. Patsi Sears, M.D.    SIT/MEDQ  D:  07/14/2002  T:  07/14/2002  Job:  [id number]

## 2010-09-06 NOTE — Op Note (Signed)
[place]  Patient:    Tina [name], Tina [name]                   MRN: [id number] Proc. Date: 02/18/00 Adm. Date:  [id number] Attending:  Stephenie Acres                           Operative Report  PREOPERATIVE DIAGNOSIS:  Right breast mass.  POSTOPERATIVE DIAGNOSIS:  Right breast mass.  OPERATION:  Excisional right breast biopsy.  SURGEON:  Catalina Lunger, M.D.  ANESTHESIA:  Local MAC  DESCRIPTION OF PROCEDURE:  The patient was taken to the operating room and placed in a supine position after adequate anesthesia was induced using MAC technique.  The right breast was prepped and draped in a normal sterile fashion.  Using 1% Lidocaine local anesthesia, the skin and subcutaneous tissue surrounding the 6:00 region of the right breast was anesthetized. Periareolar incision was made and dissected down to a normal fatty breast tissue on to a fibrous mass which was excised.  Adequate hemostasis was ensured.  Skin was closed with subcuticular 4-0 Monocryl.  Steri-Strips and sterile dressing was applied.  The patient tolerated the procedure well and went to PACU in good condition. DD:  02/18/00 TD:  02/18/00 Job: [id number]

## 2010-09-06 NOTE — Op Note (Signed)
NAME:  Tina [name], [place] O                      ACCOUNT NO.:  [id number]   MEDICAL RECORD NO.:  [id number]                   PATIENT TYPE:  AMB   LOCATION:  [facility]                                 FACILITY:  [facility]   PHYSICIAN:  Sigmund I. Patsi Sears, M.D.         DATE OF BIRTH:  May 24, 1934   DATE OF PROCEDURE:  11/17/2002  DATE OF DISCHARGE:                                 OPERATIVE REPORT   PREOPERATIVE DIAGNOSIS:  Vaginal extrusion of pubovaginal sling.   POSTOPERATIVE DIAGNOSIS:  Vaginal extrusion of pubovaginal sling.   OPERATION:  Repair of vaginal extrusion of sling, with buried strip vaginal  mucosal tissue, and reinforcement with Tutoplast fascia.   SURGEON:  Sigmund I. Patsi Sears, M.D.   ANESTHESIA:  General LMA.   PREPARATION:  After appropriate preanesthesia, the patient was brought to  the operating room, placed upon the operating table in the dorsal supine  position where general LMA anesthesia was introduced. She was then replaced  in dorsal lithotomy position where the pubis was prepped with Betadine  solution and draped in the usual fashion.   HISTORY:  This 76 year old female was status post pubovaginal sling in 2000,  and repeat pubovaginal sling in March of 2004 for recurrent stress  incontinence. The patient now presents with vaginal extrusion, for repair.   DESCRIPTION OF PROCEDURE:  A wet sponge was placed in the posterior portion  of the vagina, and a posterior weighted speculum was placed. A Foley  catheter was placed and the bladder was drained. Trendelenburg position was  accomplished, and vaginal inspection reveals that there is vaginal extrusion  of the Mentor OB tape.  The tape is excised on both right and left sides,  and the extruded portion removed. New vaginal epithelium ingrowth is noted,  and it was elected to use that as a buried strip vaginal wall. An elliptical  incision is then made, and the edges dissected all around. Following this, a  slight bit more of the OB tape is removed on both sides, and the edges  freshened. Following this, a portion of Tutoplast fascia, 2 x 7 cm, is  placed transversely in the incision, covering the buried vaginal mucosa.  Using 4-0 PDS suture, multiple simple sutures are placed, allowing closure  of the elliptical vaginal incision, with no tension. All surgical knots are  in the vaginal epithelial side. The Foley catheter was removed, and one  bleeding site on the right side of the vaginal mucosa is electrocoagulated.  0.25 plain Marcaine is injected in the wound site. 2 mL of Premarin vaginal  cream were placed in the vagina, but no packing is placed. There was a  slight amount of serosanguinous drainage, but it was elected to not place  any vaginal packing. The Foley catheter was removed, the patient was given  15 mg of IV Toradol, awakened and taken to the recovery room in good  condition.  Sigmund I. Patsi Sears, M.D.    SIT/MEDQ  D:  11/17/2002  T:  11/17/2002  Job:  [id number]

## 2011-07-02 HISTORY — PX: COLONOSCOPY: SHX174

## 2014-05-17 DIAGNOSIS — K5900 Constipation, unspecified: Secondary | ICD-10-CM | POA: Diagnosis not present

## 2014-05-17 DIAGNOSIS — N3090 Cystitis, unspecified without hematuria: Secondary | ICD-10-CM | POA: Diagnosis not present

## 2014-05-17 DIAGNOSIS — N952 Postmenopausal atrophic vaginitis: Secondary | ICD-10-CM | POA: Diagnosis not present

## 2014-06-06 DIAGNOSIS — E782 Mixed hyperlipidemia: Secondary | ICD-10-CM | POA: Diagnosis not present

## 2014-06-14 DIAGNOSIS — E782 Mixed hyperlipidemia: Secondary | ICD-10-CM | POA: Diagnosis not present

## 2014-06-14 DIAGNOSIS — K210 Gastro-esophageal reflux disease with esophagitis: Secondary | ICD-10-CM | POA: Diagnosis not present

## 2014-06-14 DIAGNOSIS — M810 Age-related osteoporosis without current pathological fracture: Secondary | ICD-10-CM | POA: Diagnosis not present

## 2014-06-14 DIAGNOSIS — M1990 Unspecified osteoarthritis, unspecified site: Secondary | ICD-10-CM | POA: Diagnosis not present

## 2014-08-14 DIAGNOSIS — J302 Other seasonal allergic rhinitis: Secondary | ICD-10-CM | POA: Diagnosis not present

## 2014-08-14 DIAGNOSIS — J301 Allergic rhinitis due to pollen: Secondary | ICD-10-CM | POA: Diagnosis not present

## 2014-08-14 DIAGNOSIS — Z6826 Body mass index (BMI) 26.0-26.9, adult: Secondary | ICD-10-CM | POA: Diagnosis not present

## 2014-08-14 DIAGNOSIS — J019 Acute sinusitis, unspecified: Secondary | ICD-10-CM | POA: Diagnosis not present

## 2014-08-21 DIAGNOSIS — H3530 Unspecified macular degeneration: Secondary | ICD-10-CM | POA: Diagnosis not present

## 2014-08-23 DIAGNOSIS — Z139 Encounter for screening, unspecified: Secondary | ICD-10-CM | POA: Diagnosis not present

## 2014-08-23 DIAGNOSIS — Z0000 Encounter for general adult medical examination without abnormal findings: Secondary | ICD-10-CM | POA: Diagnosis not present

## 2014-08-23 DIAGNOSIS — Z1389 Encounter for screening for other disorder: Secondary | ICD-10-CM | POA: Diagnosis not present

## 2014-08-23 DIAGNOSIS — Z1231 Encounter for screening mammogram for malignant neoplasm of breast: Secondary | ICD-10-CM | POA: Diagnosis not present

## 2014-09-06 DIAGNOSIS — Z1231 Encounter for screening mammogram for malignant neoplasm of breast: Secondary | ICD-10-CM | POA: Diagnosis not present

## 2014-09-06 DIAGNOSIS — N3001 Acute cystitis with hematuria: Secondary | ICD-10-CM | POA: Diagnosis not present

## 2014-09-06 DIAGNOSIS — N3090 Cystitis, unspecified without hematuria: Secondary | ICD-10-CM | POA: Diagnosis not present

## 2014-09-22 DIAGNOSIS — H1013 Acute atopic conjunctivitis, bilateral: Secondary | ICD-10-CM | POA: Diagnosis not present

## 2014-10-24 DIAGNOSIS — E782 Mixed hyperlipidemia: Secondary | ICD-10-CM | POA: Diagnosis not present

## 2014-11-01 DIAGNOSIS — Z6826 Body mass index (BMI) 26.0-26.9, adult: Secondary | ICD-10-CM | POA: Diagnosis not present

## 2014-11-01 DIAGNOSIS — J449 Chronic obstructive pulmonary disease, unspecified: Secondary | ICD-10-CM | POA: Diagnosis not present

## 2014-11-01 DIAGNOSIS — M80039D Age-related osteoporosis with current pathological fracture, unspecified forearm, subsequent encounter for fracture with routine healing: Secondary | ICD-10-CM | POA: Diagnosis not present

## 2014-11-01 DIAGNOSIS — E782 Mixed hyperlipidemia: Secondary | ICD-10-CM | POA: Diagnosis not present

## 2014-12-26 DIAGNOSIS — N3090 Cystitis, unspecified without hematuria: Secondary | ICD-10-CM | POA: Diagnosis not present

## 2014-12-26 DIAGNOSIS — N3091 Cystitis, unspecified with hematuria: Secondary | ICD-10-CM | POA: Diagnosis not present

## 2015-01-02 DIAGNOSIS — L570 Actinic keratosis: Secondary | ICD-10-CM | POA: Diagnosis not present

## 2015-01-02 DIAGNOSIS — D225 Melanocytic nevi of trunk: Secondary | ICD-10-CM | POA: Diagnosis not present

## 2015-01-02 DIAGNOSIS — L821 Other seborrheic keratosis: Secondary | ICD-10-CM | POA: Diagnosis not present

## 2015-01-02 DIAGNOSIS — L814 Other melanin hyperpigmentation: Secondary | ICD-10-CM | POA: Diagnosis not present

## 2015-01-03 DIAGNOSIS — N3080 Other cystitis without hematuria: Secondary | ICD-10-CM | POA: Diagnosis not present

## 2015-01-03 DIAGNOSIS — N952 Postmenopausal atrophic vaginitis: Secondary | ICD-10-CM | POA: Diagnosis not present

## 2015-01-10 DIAGNOSIS — Z6826 Body mass index (BMI) 26.0-26.9, adult: Secondary | ICD-10-CM | POA: Diagnosis not present

## 2015-01-10 DIAGNOSIS — M79675 Pain in left toe(s): Secondary | ICD-10-CM | POA: Diagnosis not present

## 2015-01-10 DIAGNOSIS — Z8269 Family history of other diseases of the musculoskeletal system and connective tissue: Secondary | ICD-10-CM | POA: Diagnosis not present

## 2015-02-02 DIAGNOSIS — H26492 Other secondary cataract, left eye: Secondary | ICD-10-CM | POA: Diagnosis not present

## 2015-02-05 DIAGNOSIS — Z6826 Body mass index (BMI) 26.0-26.9, adult: Secondary | ICD-10-CM | POA: Diagnosis not present

## 2015-02-05 DIAGNOSIS — M1990 Unspecified osteoarthritis, unspecified site: Secondary | ICD-10-CM | POA: Diagnosis not present

## 2015-03-28 DIAGNOSIS — E782 Mixed hyperlipidemia: Secondary | ICD-10-CM | POA: Diagnosis not present

## 2015-04-04 DIAGNOSIS — J449 Chronic obstructive pulmonary disease, unspecified: Secondary | ICD-10-CM | POA: Diagnosis not present

## 2015-04-04 DIAGNOSIS — E782 Mixed hyperlipidemia: Secondary | ICD-10-CM | POA: Diagnosis not present

## 2015-04-04 DIAGNOSIS — Z1389 Encounter for screening for other disorder: Secondary | ICD-10-CM | POA: Diagnosis not present

## 2015-04-04 DIAGNOSIS — Z139 Encounter for screening, unspecified: Secondary | ICD-10-CM | POA: Diagnosis not present

## 2015-05-03 DIAGNOSIS — L233 Allergic contact dermatitis due to drugs in contact with skin: Secondary | ICD-10-CM | POA: Diagnosis not present

## 2015-06-14 DIAGNOSIS — L821 Other seborrheic keratosis: Secondary | ICD-10-CM | POA: Diagnosis not present

## 2015-06-14 DIAGNOSIS — L249 Irritant contact dermatitis, unspecified cause: Secondary | ICD-10-CM | POA: Diagnosis not present

## 2015-07-03 DIAGNOSIS — N3090 Cystitis, unspecified without hematuria: Secondary | ICD-10-CM | POA: Diagnosis not present

## 2015-07-03 DIAGNOSIS — N952 Postmenopausal atrophic vaginitis: Secondary | ICD-10-CM | POA: Diagnosis not present

## 2015-07-03 DIAGNOSIS — K5900 Constipation, unspecified: Secondary | ICD-10-CM | POA: Diagnosis not present

## 2015-07-10 DIAGNOSIS — Z9841 Cataract extraction status, right eye: Secondary | ICD-10-CM | POA: Diagnosis not present

## 2015-07-10 DIAGNOSIS — H26491 Other secondary cataract, right eye: Secondary | ICD-10-CM | POA: Diagnosis not present

## 2015-09-05 DIAGNOSIS — Z139 Encounter for screening, unspecified: Secondary | ICD-10-CM | POA: Diagnosis not present

## 2015-09-05 DIAGNOSIS — Z0000 Encounter for general adult medical examination without abnormal findings: Secondary | ICD-10-CM | POA: Diagnosis not present

## 2015-09-05 DIAGNOSIS — F339 Major depressive disorder, recurrent, unspecified: Secondary | ICD-10-CM | POA: Diagnosis not present

## 2015-09-10 DIAGNOSIS — Z1231 Encounter for screening mammogram for malignant neoplasm of breast: Secondary | ICD-10-CM | POA: Diagnosis not present

## 2015-09-29 DIAGNOSIS — H6123 Impacted cerumen, bilateral: Secondary | ICD-10-CM | POA: Diagnosis not present

## 2015-10-30 DIAGNOSIS — F339 Major depressive disorder, recurrent, unspecified: Secondary | ICD-10-CM | POA: Diagnosis not present

## 2015-10-30 DIAGNOSIS — Z6826 Body mass index (BMI) 26.0-26.9, adult: Secondary | ICD-10-CM | POA: Diagnosis not present

## 2015-11-06 DIAGNOSIS — E782 Mixed hyperlipidemia: Secondary | ICD-10-CM | POA: Diagnosis not present

## 2015-11-14 DIAGNOSIS — E782 Mixed hyperlipidemia: Secondary | ICD-10-CM | POA: Diagnosis not present

## 2015-11-14 DIAGNOSIS — N3941 Urge incontinence: Secondary | ICD-10-CM | POA: Diagnosis not present

## 2015-11-14 DIAGNOSIS — Z6827 Body mass index (BMI) 27.0-27.9, adult: Secondary | ICD-10-CM | POA: Diagnosis not present

## 2015-11-22 DIAGNOSIS — Z0000 Encounter for general adult medical examination without abnormal findings: Secondary | ICD-10-CM | POA: Diagnosis not present

## 2015-11-22 DIAGNOSIS — Z6828 Body mass index (BMI) 28.0-28.9, adult: Secondary | ICD-10-CM | POA: Diagnosis not present

## 2015-11-22 DIAGNOSIS — Z1211 Encounter for screening for malignant neoplasm of colon: Secondary | ICD-10-CM | POA: Diagnosis not present

## 2015-12-18 DIAGNOSIS — N390 Urinary tract infection, site not specified: Secondary | ICD-10-CM | POA: Diagnosis not present

## 2016-01-25 DIAGNOSIS — Z23 Encounter for immunization: Secondary | ICD-10-CM | POA: Diagnosis not present

## 2016-04-01 DIAGNOSIS — Z7189 Other specified counseling: Secondary | ICD-10-CM | POA: Diagnosis not present

## 2016-04-01 DIAGNOSIS — E782 Mixed hyperlipidemia: Secondary | ICD-10-CM | POA: Diagnosis not present

## 2016-04-01 DIAGNOSIS — J449 Chronic obstructive pulmonary disease, unspecified: Secondary | ICD-10-CM | POA: Diagnosis not present

## 2016-04-01 DIAGNOSIS — K219 Gastro-esophageal reflux disease without esophagitis: Secondary | ICD-10-CM | POA: Diagnosis not present

## 2016-05-01 DIAGNOSIS — K219 Gastro-esophageal reflux disease without esophagitis: Secondary | ICD-10-CM | POA: Diagnosis not present

## 2016-05-01 DIAGNOSIS — J449 Chronic obstructive pulmonary disease, unspecified: Secondary | ICD-10-CM | POA: Diagnosis not present

## 2016-05-01 DIAGNOSIS — Z6827 Body mass index (BMI) 27.0-27.9, adult: Secondary | ICD-10-CM | POA: Diagnosis not present

## 2016-05-26 DIAGNOSIS — Z6827 Body mass index (BMI) 27.0-27.9, adult: Secondary | ICD-10-CM | POA: Diagnosis not present

## 2016-05-26 DIAGNOSIS — R112 Nausea with vomiting, unspecified: Secondary | ICD-10-CM | POA: Diagnosis not present

## 2016-05-26 DIAGNOSIS — R05 Cough: Secondary | ICD-10-CM | POA: Diagnosis not present

## 2016-07-03 DIAGNOSIS — J449 Chronic obstructive pulmonary disease, unspecified: Secondary | ICD-10-CM | POA: Diagnosis not present

## 2016-07-03 DIAGNOSIS — Z139 Encounter for screening, unspecified: Secondary | ICD-10-CM | POA: Diagnosis not present

## 2016-07-03 DIAGNOSIS — Z1389 Encounter for screening for other disorder: Secondary | ICD-10-CM | POA: Diagnosis not present

## 2016-07-03 DIAGNOSIS — Z0000 Encounter for general adult medical examination without abnormal findings: Secondary | ICD-10-CM | POA: Diagnosis not present

## 2016-07-07 DIAGNOSIS — M799 Soft tissue disorder, unspecified: Secondary | ICD-10-CM | POA: Diagnosis not present

## 2016-07-07 DIAGNOSIS — M5442 Lumbago with sciatica, left side: Secondary | ICD-10-CM | POA: Diagnosis not present

## 2016-07-07 DIAGNOSIS — M6281 Muscle weakness (generalized): Secondary | ICD-10-CM | POA: Diagnosis not present

## 2016-07-08 DIAGNOSIS — M6281 Muscle weakness (generalized): Secondary | ICD-10-CM | POA: Diagnosis not present

## 2016-07-08 DIAGNOSIS — M799 Soft tissue disorder, unspecified: Secondary | ICD-10-CM | POA: Diagnosis not present

## 2016-07-08 DIAGNOSIS — M5442 Lumbago with sciatica, left side: Secondary | ICD-10-CM | POA: Diagnosis not present

## 2016-07-14 DIAGNOSIS — M5442 Lumbago with sciatica, left side: Secondary | ICD-10-CM | POA: Diagnosis not present

## 2016-07-14 DIAGNOSIS — M6281 Muscle weakness (generalized): Secondary | ICD-10-CM | POA: Diagnosis not present

## 2016-07-14 DIAGNOSIS — M799 Soft tissue disorder, unspecified: Secondary | ICD-10-CM | POA: Diagnosis not present

## 2016-07-16 DIAGNOSIS — M6281 Muscle weakness (generalized): Secondary | ICD-10-CM | POA: Diagnosis not present

## 2016-07-16 DIAGNOSIS — M5442 Lumbago with sciatica, left side: Secondary | ICD-10-CM | POA: Diagnosis not present

## 2016-07-16 DIAGNOSIS — M799 Soft tissue disorder, unspecified: Secondary | ICD-10-CM | POA: Diagnosis not present

## 2016-07-18 DIAGNOSIS — M6281 Muscle weakness (generalized): Secondary | ICD-10-CM | POA: Diagnosis not present

## 2016-07-18 DIAGNOSIS — M799 Soft tissue disorder, unspecified: Secondary | ICD-10-CM | POA: Diagnosis not present

## 2016-07-18 DIAGNOSIS — M5442 Lumbago with sciatica, left side: Secondary | ICD-10-CM | POA: Diagnosis not present

## 2016-07-21 DIAGNOSIS — M799 Soft tissue disorder, unspecified: Secondary | ICD-10-CM | POA: Diagnosis not present

## 2016-07-21 DIAGNOSIS — M6281 Muscle weakness (generalized): Secondary | ICD-10-CM | POA: Diagnosis not present

## 2016-07-21 DIAGNOSIS — M5442 Lumbago with sciatica, left side: Secondary | ICD-10-CM | POA: Diagnosis not present

## 2016-07-22 DIAGNOSIS — L821 Other seborrheic keratosis: Secondary | ICD-10-CM | POA: Diagnosis not present

## 2016-07-22 DIAGNOSIS — L570 Actinic keratosis: Secondary | ICD-10-CM | POA: Diagnosis not present

## 2016-07-22 DIAGNOSIS — D225 Melanocytic nevi of trunk: Secondary | ICD-10-CM | POA: Diagnosis not present

## 2016-07-22 DIAGNOSIS — L578 Other skin changes due to chronic exposure to nonionizing radiation: Secondary | ICD-10-CM | POA: Diagnosis not present

## 2016-07-22 DIAGNOSIS — L814 Other melanin hyperpigmentation: Secondary | ICD-10-CM | POA: Diagnosis not present

## 2016-07-23 DIAGNOSIS — M799 Soft tissue disorder, unspecified: Secondary | ICD-10-CM | POA: Diagnosis not present

## 2016-07-23 DIAGNOSIS — M6281 Muscle weakness (generalized): Secondary | ICD-10-CM | POA: Diagnosis not present

## 2016-07-23 DIAGNOSIS — M5442 Lumbago with sciatica, left side: Secondary | ICD-10-CM | POA: Diagnosis not present

## 2016-07-25 DIAGNOSIS — M6281 Muscle weakness (generalized): Secondary | ICD-10-CM | POA: Diagnosis not present

## 2016-07-25 DIAGNOSIS — M799 Soft tissue disorder, unspecified: Secondary | ICD-10-CM | POA: Diagnosis not present

## 2016-07-25 DIAGNOSIS — M5442 Lumbago with sciatica, left side: Secondary | ICD-10-CM | POA: Diagnosis not present

## 2016-07-30 DIAGNOSIS — M799 Soft tissue disorder, unspecified: Secondary | ICD-10-CM | POA: Diagnosis not present

## 2016-07-30 DIAGNOSIS — M5442 Lumbago with sciatica, left side: Secondary | ICD-10-CM | POA: Diagnosis not present

## 2016-07-30 DIAGNOSIS — M6281 Muscle weakness (generalized): Secondary | ICD-10-CM | POA: Diagnosis not present

## 2016-08-01 DIAGNOSIS — M5442 Lumbago with sciatica, left side: Secondary | ICD-10-CM | POA: Diagnosis not present

## 2016-08-01 DIAGNOSIS — M6281 Muscle weakness (generalized): Secondary | ICD-10-CM | POA: Diagnosis not present

## 2016-08-01 DIAGNOSIS — M799 Soft tissue disorder, unspecified: Secondary | ICD-10-CM | POA: Diagnosis not present

## 2016-08-12 DIAGNOSIS — M7062 Trochanteric bursitis, left hip: Secondary | ICD-10-CM | POA: Diagnosis not present

## 2016-08-12 DIAGNOSIS — M5432 Sciatica, left side: Secondary | ICD-10-CM | POA: Diagnosis not present

## 2016-08-12 DIAGNOSIS — G5702 Lesion of sciatic nerve, left lower limb: Secondary | ICD-10-CM | POA: Diagnosis not present

## 2016-08-15 DIAGNOSIS — M5442 Lumbago with sciatica, left side: Secondary | ICD-10-CM | POA: Diagnosis not present

## 2016-08-15 DIAGNOSIS — M799 Soft tissue disorder, unspecified: Secondary | ICD-10-CM | POA: Diagnosis not present

## 2016-08-15 DIAGNOSIS — M6281 Muscle weakness (generalized): Secondary | ICD-10-CM | POA: Diagnosis not present

## 2016-08-18 DIAGNOSIS — M799 Soft tissue disorder, unspecified: Secondary | ICD-10-CM | POA: Diagnosis not present

## 2016-08-18 DIAGNOSIS — M6281 Muscle weakness (generalized): Secondary | ICD-10-CM | POA: Diagnosis not present

## 2016-08-18 DIAGNOSIS — M5442 Lumbago with sciatica, left side: Secondary | ICD-10-CM | POA: Diagnosis not present

## 2016-08-20 DIAGNOSIS — M799 Soft tissue disorder, unspecified: Secondary | ICD-10-CM | POA: Diagnosis not present

## 2016-08-20 DIAGNOSIS — M5442 Lumbago with sciatica, left side: Secondary | ICD-10-CM | POA: Diagnosis not present

## 2016-08-20 DIAGNOSIS — M6281 Muscle weakness (generalized): Secondary | ICD-10-CM | POA: Diagnosis not present

## 2016-08-27 DIAGNOSIS — M6281 Muscle weakness (generalized): Secondary | ICD-10-CM | POA: Diagnosis not present

## 2016-08-27 DIAGNOSIS — M799 Soft tissue disorder, unspecified: Secondary | ICD-10-CM | POA: Diagnosis not present

## 2016-08-27 DIAGNOSIS — M5442 Lumbago with sciatica, left side: Secondary | ICD-10-CM | POA: Diagnosis not present

## 2016-09-03 DIAGNOSIS — M799 Soft tissue disorder, unspecified: Secondary | ICD-10-CM | POA: Diagnosis not present

## 2016-09-03 DIAGNOSIS — M5442 Lumbago with sciatica, left side: Secondary | ICD-10-CM | POA: Diagnosis not present

## 2016-09-03 DIAGNOSIS — M6281 Muscle weakness (generalized): Secondary | ICD-10-CM | POA: Diagnosis not present

## 2016-09-15 DIAGNOSIS — R05 Cough: Secondary | ICD-10-CM | POA: Diagnosis not present

## 2016-09-15 DIAGNOSIS — J069 Acute upper respiratory infection, unspecified: Secondary | ICD-10-CM | POA: Diagnosis not present

## 2016-10-13 DIAGNOSIS — E782 Mixed hyperlipidemia: Secondary | ICD-10-CM | POA: Diagnosis not present

## 2016-10-13 DIAGNOSIS — F339 Major depressive disorder, recurrent, unspecified: Secondary | ICD-10-CM | POA: Diagnosis not present

## 2016-10-13 DIAGNOSIS — Z139 Encounter for screening, unspecified: Secondary | ICD-10-CM | POA: Diagnosis not present

## 2016-10-13 DIAGNOSIS — K219 Gastro-esophageal reflux disease without esophagitis: Secondary | ICD-10-CM | POA: Diagnosis not present

## 2016-10-13 DIAGNOSIS — J449 Chronic obstructive pulmonary disease, unspecified: Secondary | ICD-10-CM | POA: Diagnosis not present

## 2016-10-13 DIAGNOSIS — F3160 Bipolar disorder, current episode mixed, unspecified: Secondary | ICD-10-CM | POA: Diagnosis not present

## 2016-10-24 DIAGNOSIS — E782 Mixed hyperlipidemia: Secondary | ICD-10-CM | POA: Diagnosis not present

## 2016-10-24 DIAGNOSIS — Z1231 Encounter for screening mammogram for malignant neoplasm of breast: Secondary | ICD-10-CM | POA: Diagnosis not present

## 2016-11-04 DIAGNOSIS — Z6825 Body mass index (BMI) 25.0-25.9, adult: Secondary | ICD-10-CM | POA: Diagnosis not present

## 2016-11-04 DIAGNOSIS — E782 Mixed hyperlipidemia: Secondary | ICD-10-CM | POA: Diagnosis not present

## 2016-11-04 DIAGNOSIS — F339 Major depressive disorder, recurrent, unspecified: Secondary | ICD-10-CM | POA: Diagnosis not present

## 2016-12-18 DIAGNOSIS — N390 Urinary tract infection, site not specified: Secondary | ICD-10-CM | POA: Diagnosis not present

## 2016-12-18 DIAGNOSIS — B001 Herpesviral vesicular dermatitis: Secondary | ICD-10-CM | POA: Diagnosis not present

## 2016-12-18 DIAGNOSIS — Z6825 Body mass index (BMI) 25.0-25.9, adult: Secondary | ICD-10-CM | POA: Diagnosis not present

## 2016-12-25 DIAGNOSIS — N390 Urinary tract infection, site not specified: Secondary | ICD-10-CM | POA: Diagnosis not present

## 2016-12-25 DIAGNOSIS — Z6825 Body mass index (BMI) 25.0-25.9, adult: Secondary | ICD-10-CM | POA: Diagnosis not present

## 2016-12-25 DIAGNOSIS — Z23 Encounter for immunization: Secondary | ICD-10-CM | POA: Diagnosis not present

## 2016-12-26 DIAGNOSIS — N390 Urinary tract infection, site not specified: Secondary | ICD-10-CM | POA: Diagnosis not present

## 2017-01-02 DIAGNOSIS — R5383 Other fatigue: Secondary | ICD-10-CM | POA: Diagnosis not present

## 2017-01-02 DIAGNOSIS — R7989 Other specified abnormal findings of blood chemistry: Secondary | ICD-10-CM | POA: Diagnosis not present

## 2017-01-06 DIAGNOSIS — Z961 Presence of intraocular lens: Secondary | ICD-10-CM | POA: Diagnosis not present

## 2017-01-06 DIAGNOSIS — H353132 Nonexudative age-related macular degeneration, bilateral, intermediate dry stage: Secondary | ICD-10-CM | POA: Diagnosis not present

## 2017-01-12 DIAGNOSIS — H52209 Unspecified astigmatism, unspecified eye: Secondary | ICD-10-CM | POA: Diagnosis not present

## 2017-01-12 DIAGNOSIS — H524 Presbyopia: Secondary | ICD-10-CM | POA: Diagnosis not present

## 2017-01-12 DIAGNOSIS — H5203 Hypermetropia, bilateral: Secondary | ICD-10-CM | POA: Diagnosis not present

## 2017-02-12 DIAGNOSIS — Z6825 Body mass index (BMI) 25.0-25.9, adult: Secondary | ICD-10-CM | POA: Diagnosis not present

## 2017-02-12 DIAGNOSIS — J449 Chronic obstructive pulmonary disease, unspecified: Secondary | ICD-10-CM | POA: Diagnosis not present

## 2017-03-23 DIAGNOSIS — J040 Acute laryngitis: Secondary | ICD-10-CM | POA: Diagnosis not present

## 2017-03-23 DIAGNOSIS — R05 Cough: Secondary | ICD-10-CM | POA: Diagnosis not present

## 2017-03-23 DIAGNOSIS — Z6824 Body mass index (BMI) 24.0-24.9, adult: Secondary | ICD-10-CM | POA: Diagnosis not present

## 2017-03-23 DIAGNOSIS — J449 Chronic obstructive pulmonary disease, unspecified: Secondary | ICD-10-CM | POA: Diagnosis not present

## 2017-04-08 DIAGNOSIS — N3281 Overactive bladder: Secondary | ICD-10-CM | POA: Diagnosis not present

## 2017-04-08 DIAGNOSIS — N3090 Cystitis, unspecified without hematuria: Secondary | ICD-10-CM | POA: Diagnosis not present

## 2017-04-08 DIAGNOSIS — R339 Retention of urine, unspecified: Secondary | ICD-10-CM | POA: Diagnosis not present

## 2017-04-24 DIAGNOSIS — E782 Mixed hyperlipidemia: Secondary | ICD-10-CM | POA: Diagnosis not present

## 2017-04-24 DIAGNOSIS — J449 Chronic obstructive pulmonary disease, unspecified: Secondary | ICD-10-CM | POA: Diagnosis not present

## 2017-04-24 DIAGNOSIS — Z6824 Body mass index (BMI) 24.0-24.9, adult: Secondary | ICD-10-CM | POA: Diagnosis not present

## 2017-04-24 DIAGNOSIS — K219 Gastro-esophageal reflux disease without esophagitis: Secondary | ICD-10-CM | POA: Diagnosis not present

## 2017-04-27 DIAGNOSIS — E782 Mixed hyperlipidemia: Secondary | ICD-10-CM | POA: Diagnosis not present

## 2017-04-30 DIAGNOSIS — Z0100 Encounter for examination of eyes and vision without abnormal findings: Secondary | ICD-10-CM | POA: Diagnosis not present

## 2017-06-15 DIAGNOSIS — Z6824 Body mass index (BMI) 24.0-24.9, adult: Secondary | ICD-10-CM | POA: Diagnosis not present

## 2017-06-15 DIAGNOSIS — J101 Influenza due to other identified influenza virus with other respiratory manifestations: Secondary | ICD-10-CM | POA: Diagnosis not present

## 2017-06-22 DIAGNOSIS — R0602 Shortness of breath: Secondary | ICD-10-CM | POA: Diagnosis not present

## 2017-06-22 DIAGNOSIS — R918 Other nonspecific abnormal finding of lung field: Secondary | ICD-10-CM | POA: Diagnosis not present

## 2017-06-22 DIAGNOSIS — Z6824 Body mass index (BMI) 24.0-24.9, adult: Secondary | ICD-10-CM | POA: Diagnosis not present

## 2017-06-22 DIAGNOSIS — J441 Chronic obstructive pulmonary disease with (acute) exacerbation: Secondary | ICD-10-CM | POA: Diagnosis not present

## 2017-06-22 DIAGNOSIS — J111 Influenza due to unidentified influenza virus with other respiratory manifestations: Secondary | ICD-10-CM | POA: Diagnosis not present

## 2017-06-22 DIAGNOSIS — Z789 Other specified health status: Secondary | ICD-10-CM | POA: Diagnosis not present

## 2017-06-25 DIAGNOSIS — E782 Mixed hyperlipidemia: Secondary | ICD-10-CM | POA: Diagnosis not present

## 2017-07-02 DIAGNOSIS — E782 Mixed hyperlipidemia: Secondary | ICD-10-CM | POA: Diagnosis not present

## 2017-07-02 DIAGNOSIS — Z1331 Encounter for screening for depression: Secondary | ICD-10-CM | POA: Diagnosis not present

## 2017-07-02 DIAGNOSIS — Z0000 Encounter for general adult medical examination without abnormal findings: Secondary | ICD-10-CM | POA: Diagnosis not present

## 2017-07-02 DIAGNOSIS — J449 Chronic obstructive pulmonary disease, unspecified: Secondary | ICD-10-CM | POA: Diagnosis not present

## 2017-07-02 DIAGNOSIS — Z6824 Body mass index (BMI) 24.0-24.9, adult: Secondary | ICD-10-CM | POA: Diagnosis not present

## 2017-07-02 DIAGNOSIS — Z139 Encounter for screening, unspecified: Secondary | ICD-10-CM | POA: Diagnosis not present

## 2017-07-13 DIAGNOSIS — H353132 Nonexudative age-related macular degeneration, bilateral, intermediate dry stage: Secondary | ICD-10-CM | POA: Diagnosis not present

## 2017-07-20 DIAGNOSIS — J449 Chronic obstructive pulmonary disease, unspecified: Secondary | ICD-10-CM | POA: Diagnosis not present

## 2017-07-21 DIAGNOSIS — L570 Actinic keratosis: Secondary | ICD-10-CM | POA: Diagnosis not present

## 2017-07-21 DIAGNOSIS — L814 Other melanin hyperpigmentation: Secondary | ICD-10-CM | POA: Diagnosis not present

## 2017-07-21 DIAGNOSIS — D1801 Hemangioma of skin and subcutaneous tissue: Secondary | ICD-10-CM | POA: Diagnosis not present

## 2017-07-21 DIAGNOSIS — D225 Melanocytic nevi of trunk: Secondary | ICD-10-CM | POA: Diagnosis not present

## 2017-08-10 DIAGNOSIS — Z6824 Body mass index (BMI) 24.0-24.9, adult: Secondary | ICD-10-CM | POA: Diagnosis not present

## 2017-08-10 DIAGNOSIS — J309 Allergic rhinitis, unspecified: Secondary | ICD-10-CM | POA: Diagnosis not present

## 2017-08-10 DIAGNOSIS — J449 Chronic obstructive pulmonary disease, unspecified: Secondary | ICD-10-CM | POA: Diagnosis not present

## 2017-08-18 DIAGNOSIS — J22 Unspecified acute lower respiratory infection: Secondary | ICD-10-CM | POA: Diagnosis not present

## 2017-08-18 DIAGNOSIS — J439 Emphysema, unspecified: Secondary | ICD-10-CM | POA: Diagnosis not present

## 2017-08-18 DIAGNOSIS — M545 Low back pain: Secondary | ICD-10-CM | POA: Diagnosis not present

## 2017-08-27 DIAGNOSIS — Z6824 Body mass index (BMI) 24.0-24.9, adult: Secondary | ICD-10-CM | POA: Diagnosis not present

## 2017-08-27 DIAGNOSIS — J441 Chronic obstructive pulmonary disease with (acute) exacerbation: Secondary | ICD-10-CM | POA: Diagnosis not present

## 2017-08-27 DIAGNOSIS — Z139 Encounter for screening, unspecified: Secondary | ICD-10-CM | POA: Diagnosis not present

## 2017-08-27 DIAGNOSIS — R079 Chest pain, unspecified: Secondary | ICD-10-CM | POA: Diagnosis not present

## 2017-08-27 DIAGNOSIS — R05 Cough: Secondary | ICD-10-CM | POA: Diagnosis not present

## 2017-09-03 DIAGNOSIS — Z139 Encounter for screening, unspecified: Secondary | ICD-10-CM | POA: Diagnosis not present

## 2017-09-03 DIAGNOSIS — Z6824 Body mass index (BMI) 24.0-24.9, adult: Secondary | ICD-10-CM | POA: Diagnosis not present

## 2017-09-03 DIAGNOSIS — J181 Lobar pneumonia, unspecified organism: Secondary | ICD-10-CM | POA: Diagnosis not present

## 2017-09-12 DIAGNOSIS — N3000 Acute cystitis without hematuria: Secondary | ICD-10-CM | POA: Diagnosis not present

## 2017-09-12 DIAGNOSIS — N3001 Acute cystitis with hematuria: Secondary | ICD-10-CM | POA: Diagnosis not present

## 2017-09-22 DIAGNOSIS — R0602 Shortness of breath: Secondary | ICD-10-CM | POA: Diagnosis not present

## 2017-09-22 DIAGNOSIS — J440 Chronic obstructive pulmonary disease with acute lower respiratory infection: Secondary | ICD-10-CM | POA: Diagnosis not present

## 2017-09-22 DIAGNOSIS — I700 Atherosclerosis of aorta: Secondary | ICD-10-CM | POA: Diagnosis not present

## 2017-09-22 DIAGNOSIS — J181 Lobar pneumonia, unspecified organism: Secondary | ICD-10-CM | POA: Diagnosis not present

## 2017-10-05 DIAGNOSIS — R9389 Abnormal findings on diagnostic imaging of other specified body structures: Secondary | ICD-10-CM | POA: Diagnosis not present

## 2017-10-05 DIAGNOSIS — Z6825 Body mass index (BMI) 25.0-25.9, adult: Secondary | ICD-10-CM | POA: Diagnosis not present

## 2017-10-05 DIAGNOSIS — J181 Lobar pneumonia, unspecified organism: Secondary | ICD-10-CM | POA: Diagnosis not present

## 2017-10-05 DIAGNOSIS — J441 Chronic obstructive pulmonary disease with (acute) exacerbation: Secondary | ICD-10-CM | POA: Diagnosis not present

## 2017-10-07 DIAGNOSIS — R918 Other nonspecific abnormal finding of lung field: Secondary | ICD-10-CM | POA: Diagnosis not present

## 2017-10-07 DIAGNOSIS — R9389 Abnormal findings on diagnostic imaging of other specified body structures: Secondary | ICD-10-CM | POA: Diagnosis not present

## 2017-10-07 DIAGNOSIS — J181 Lobar pneumonia, unspecified organism: Secondary | ICD-10-CM | POA: Diagnosis not present

## 2017-10-12 DIAGNOSIS — Z139 Encounter for screening, unspecified: Secondary | ICD-10-CM | POA: Diagnosis not present

## 2017-10-12 DIAGNOSIS — R911 Solitary pulmonary nodule: Secondary | ICD-10-CM | POA: Diagnosis not present

## 2017-10-12 DIAGNOSIS — Z6825 Body mass index (BMI) 25.0-25.9, adult: Secondary | ICD-10-CM | POA: Diagnosis not present

## 2017-10-12 DIAGNOSIS — J181 Lobar pneumonia, unspecified organism: Secondary | ICD-10-CM | POA: Diagnosis not present

## 2017-10-18 DIAGNOSIS — J449 Chronic obstructive pulmonary disease, unspecified: Secondary | ICD-10-CM | POA: Diagnosis not present

## 2017-10-18 DIAGNOSIS — J441 Chronic obstructive pulmonary disease with (acute) exacerbation: Secondary | ICD-10-CM | POA: Diagnosis not present

## 2017-11-04 DIAGNOSIS — N3281 Overactive bladder: Secondary | ICD-10-CM | POA: Diagnosis not present

## 2017-11-04 DIAGNOSIS — N3090 Cystitis, unspecified without hematuria: Secondary | ICD-10-CM | POA: Diagnosis not present

## 2017-11-18 DIAGNOSIS — J181 Lobar pneumonia, unspecified organism: Secondary | ICD-10-CM | POA: Diagnosis not present

## 2017-11-18 DIAGNOSIS — J449 Chronic obstructive pulmonary disease, unspecified: Secondary | ICD-10-CM | POA: Diagnosis not present

## 2017-11-18 DIAGNOSIS — J441 Chronic obstructive pulmonary disease with (acute) exacerbation: Secondary | ICD-10-CM | POA: Diagnosis not present

## 2017-11-18 DIAGNOSIS — R911 Solitary pulmonary nodule: Secondary | ICD-10-CM | POA: Diagnosis not present

## 2017-12-07 DIAGNOSIS — R079 Chest pain, unspecified: Secondary | ICD-10-CM | POA: Diagnosis not present

## 2017-12-07 DIAGNOSIS — Z139 Encounter for screening, unspecified: Secondary | ICD-10-CM | POA: Diagnosis not present

## 2017-12-07 DIAGNOSIS — Z1331 Encounter for screening for depression: Secondary | ICD-10-CM | POA: Diagnosis not present

## 2017-12-07 DIAGNOSIS — Z6825 Body mass index (BMI) 25.0-25.9, adult: Secondary | ICD-10-CM | POA: Diagnosis not present

## 2017-12-18 DIAGNOSIS — J441 Chronic obstructive pulmonary disease with (acute) exacerbation: Secondary | ICD-10-CM | POA: Diagnosis not present

## 2017-12-18 DIAGNOSIS — J449 Chronic obstructive pulmonary disease, unspecified: Secondary | ICD-10-CM | POA: Diagnosis not present

## 2017-12-30 DIAGNOSIS — R911 Solitary pulmonary nodule: Secondary | ICD-10-CM | POA: Diagnosis not present

## 2018-01-11 DIAGNOSIS — Z961 Presence of intraocular lens: Secondary | ICD-10-CM | POA: Diagnosis not present

## 2018-01-11 DIAGNOSIS — H04123 Dry eye syndrome of bilateral lacrimal glands: Secondary | ICD-10-CM | POA: Diagnosis not present

## 2018-01-11 DIAGNOSIS — H353132 Nonexudative age-related macular degeneration, bilateral, intermediate dry stage: Secondary | ICD-10-CM | POA: Diagnosis not present

## 2018-01-18 DIAGNOSIS — J181 Lobar pneumonia, unspecified organism: Secondary | ICD-10-CM | POA: Diagnosis not present

## 2018-01-18 DIAGNOSIS — J449 Chronic obstructive pulmonary disease, unspecified: Secondary | ICD-10-CM | POA: Diagnosis not present

## 2018-01-18 DIAGNOSIS — J441 Chronic obstructive pulmonary disease with (acute) exacerbation: Secondary | ICD-10-CM | POA: Diagnosis not present

## 2018-02-04 DIAGNOSIS — K210 Gastro-esophageal reflux disease with esophagitis: Secondary | ICD-10-CM | POA: Diagnosis not present

## 2018-02-15 DIAGNOSIS — K219 Gastro-esophageal reflux disease without esophagitis: Secondary | ICD-10-CM | POA: Diagnosis not present

## 2018-02-15 DIAGNOSIS — R1314 Dysphagia, pharyngoesophageal phase: Secondary | ICD-10-CM | POA: Diagnosis not present

## 2018-02-15 DIAGNOSIS — Z139 Encounter for screening, unspecified: Secondary | ICD-10-CM | POA: Diagnosis not present

## 2018-02-15 DIAGNOSIS — Z6825 Body mass index (BMI) 25.0-25.9, adult: Secondary | ICD-10-CM | POA: Diagnosis not present

## 2018-02-17 DIAGNOSIS — R9431 Abnormal electrocardiogram [ECG] [EKG]: Secondary | ICD-10-CM | POA: Diagnosis not present

## 2018-02-17 DIAGNOSIS — I208 Other forms of angina pectoris: Secondary | ICD-10-CM | POA: Diagnosis not present

## 2018-02-17 DIAGNOSIS — Z8249 Family history of ischemic heart disease and other diseases of the circulatory system: Secondary | ICD-10-CM | POA: Diagnosis not present

## 2018-02-17 DIAGNOSIS — R0609 Other forms of dyspnea: Secondary | ICD-10-CM | POA: Diagnosis not present

## 2018-02-17 DIAGNOSIS — J439 Emphysema, unspecified: Secondary | ICD-10-CM | POA: Diagnosis not present

## 2018-02-17 DIAGNOSIS — E785 Hyperlipidemia, unspecified: Secondary | ICD-10-CM | POA: Diagnosis not present

## 2018-02-17 DIAGNOSIS — I2510 Atherosclerotic heart disease of native coronary artery without angina pectoris: Secondary | ICD-10-CM | POA: Diagnosis not present

## 2018-02-17 DIAGNOSIS — I429 Cardiomyopathy, unspecified: Secondary | ICD-10-CM | POA: Diagnosis not present

## 2018-02-17 DIAGNOSIS — R5383 Other fatigue: Secondary | ICD-10-CM | POA: Diagnosis not present

## 2018-02-18 DIAGNOSIS — K210 Gastro-esophageal reflux disease with esophagitis: Secondary | ICD-10-CM | POA: Diagnosis not present

## 2018-02-18 DIAGNOSIS — J441 Chronic obstructive pulmonary disease with (acute) exacerbation: Secondary | ICD-10-CM | POA: Diagnosis not present

## 2018-02-18 DIAGNOSIS — J449 Chronic obstructive pulmonary disease, unspecified: Secondary | ICD-10-CM | POA: Diagnosis not present

## 2018-03-20 DIAGNOSIS — J449 Chronic obstructive pulmonary disease, unspecified: Secondary | ICD-10-CM | POA: Diagnosis not present

## 2018-03-20 DIAGNOSIS — K210 Gastro-esophageal reflux disease with esophagitis: Secondary | ICD-10-CM | POA: Diagnosis not present

## 2018-03-20 DIAGNOSIS — J441 Chronic obstructive pulmonary disease with (acute) exacerbation: Secondary | ICD-10-CM | POA: Diagnosis not present

## 2018-03-23 DIAGNOSIS — R12 Heartburn: Secondary | ICD-10-CM | POA: Diagnosis not present

## 2018-03-29 DIAGNOSIS — R5383 Other fatigue: Secondary | ICD-10-CM | POA: Diagnosis not present

## 2018-03-29 DIAGNOSIS — R0609 Other forms of dyspnea: Secondary | ICD-10-CM | POA: Diagnosis not present

## 2018-03-29 DIAGNOSIS — E785 Hyperlipidemia, unspecified: Secondary | ICD-10-CM | POA: Diagnosis not present

## 2018-03-29 DIAGNOSIS — I429 Cardiomyopathy, unspecified: Secondary | ICD-10-CM | POA: Diagnosis not present

## 2018-03-29 DIAGNOSIS — I2510 Atherosclerotic heart disease of native coronary artery without angina pectoris: Secondary | ICD-10-CM | POA: Diagnosis not present

## 2018-03-29 DIAGNOSIS — J439 Emphysema, unspecified: Secondary | ICD-10-CM | POA: Diagnosis not present

## 2018-03-29 DIAGNOSIS — Z8249 Family history of ischemic heart disease and other diseases of the circulatory system: Secondary | ICD-10-CM | POA: Diagnosis not present

## 2018-03-29 DIAGNOSIS — I200 Unstable angina: Secondary | ICD-10-CM | POA: Diagnosis not present

## 2018-03-29 DIAGNOSIS — I208 Other forms of angina pectoris: Secondary | ICD-10-CM | POA: Diagnosis not present

## 2018-03-29 DIAGNOSIS — R9431 Abnormal electrocardiogram [ECG] [EKG]: Secondary | ICD-10-CM | POA: Diagnosis not present

## 2018-03-31 DIAGNOSIS — R12 Heartburn: Secondary | ICD-10-CM | POA: Diagnosis not present

## 2018-03-31 DIAGNOSIS — K222 Esophageal obstruction: Secondary | ICD-10-CM | POA: Diagnosis not present

## 2018-04-01 DIAGNOSIS — J449 Chronic obstructive pulmonary disease, unspecified: Secondary | ICD-10-CM | POA: Diagnosis not present

## 2018-04-01 DIAGNOSIS — Z6824 Body mass index (BMI) 24.0-24.9, adult: Secondary | ICD-10-CM | POA: Diagnosis not present

## 2018-04-07 DIAGNOSIS — R9431 Abnormal electrocardiogram [ECG] [EKG]: Secondary | ICD-10-CM | POA: Diagnosis not present

## 2018-04-07 DIAGNOSIS — R0609 Other forms of dyspnea: Secondary | ICD-10-CM | POA: Diagnosis not present

## 2018-04-07 DIAGNOSIS — R5383 Other fatigue: Secondary | ICD-10-CM | POA: Diagnosis not present

## 2018-04-07 DIAGNOSIS — E785 Hyperlipidemia, unspecified: Secondary | ICD-10-CM | POA: Diagnosis not present

## 2018-04-07 DIAGNOSIS — I208 Other forms of angina pectoris: Secondary | ICD-10-CM | POA: Diagnosis not present

## 2018-04-07 DIAGNOSIS — R011 Cardiac murmur, unspecified: Secondary | ICD-10-CM | POA: Diagnosis not present

## 2018-04-07 DIAGNOSIS — J439 Emphysema, unspecified: Secondary | ICD-10-CM | POA: Diagnosis not present

## 2018-04-07 DIAGNOSIS — Z8249 Family history of ischemic heart disease and other diseases of the circulatory system: Secondary | ICD-10-CM | POA: Diagnosis not present

## 2018-04-07 DIAGNOSIS — I429 Cardiomyopathy, unspecified: Secondary | ICD-10-CM | POA: Diagnosis not present

## 2018-04-20 DIAGNOSIS — K210 Gastro-esophageal reflux disease with esophagitis: Secondary | ICD-10-CM | POA: Diagnosis not present

## 2018-04-20 DIAGNOSIS — J449 Chronic obstructive pulmonary disease, unspecified: Secondary | ICD-10-CM | POA: Diagnosis not present

## 2018-04-20 DIAGNOSIS — J441 Chronic obstructive pulmonary disease with (acute) exacerbation: Secondary | ICD-10-CM | POA: Diagnosis not present

## 2018-05-03 DIAGNOSIS — E782 Mixed hyperlipidemia: Secondary | ICD-10-CM | POA: Diagnosis not present

## 2018-05-10 DIAGNOSIS — Z0000 Encounter for general adult medical examination without abnormal findings: Secondary | ICD-10-CM | POA: Diagnosis not present

## 2018-05-10 DIAGNOSIS — J449 Chronic obstructive pulmonary disease, unspecified: Secondary | ICD-10-CM | POA: Diagnosis not present

## 2018-05-10 DIAGNOSIS — F339 Major depressive disorder, recurrent, unspecified: Secondary | ICD-10-CM | POA: Diagnosis not present

## 2018-05-10 DIAGNOSIS — Z139 Encounter for screening, unspecified: Secondary | ICD-10-CM | POA: Diagnosis not present

## 2018-05-10 DIAGNOSIS — E782 Mixed hyperlipidemia: Secondary | ICD-10-CM | POA: Diagnosis not present

## 2018-05-21 DIAGNOSIS — E782 Mixed hyperlipidemia: Secondary | ICD-10-CM | POA: Diagnosis not present

## 2018-05-21 DIAGNOSIS — J441 Chronic obstructive pulmonary disease with (acute) exacerbation: Secondary | ICD-10-CM | POA: Diagnosis not present

## 2018-05-21 DIAGNOSIS — K210 Gastro-esophageal reflux disease with esophagitis: Secondary | ICD-10-CM | POA: Diagnosis not present

## 2018-05-21 DIAGNOSIS — J449 Chronic obstructive pulmonary disease, unspecified: Secondary | ICD-10-CM | POA: Diagnosis not present

## 2018-06-03 DIAGNOSIS — Z1231 Encounter for screening mammogram for malignant neoplasm of breast: Secondary | ICD-10-CM | POA: Diagnosis not present

## 2018-06-16 DIAGNOSIS — R0609 Other forms of dyspnea: Secondary | ICD-10-CM | POA: Diagnosis not present

## 2018-06-16 DIAGNOSIS — J45909 Unspecified asthma, uncomplicated: Secondary | ICD-10-CM | POA: Diagnosis not present

## 2018-06-16 DIAGNOSIS — R5383 Other fatigue: Secondary | ICD-10-CM | POA: Diagnosis not present

## 2018-06-16 DIAGNOSIS — R9431 Abnormal electrocardiogram [ECG] [EKG]: Secondary | ICD-10-CM | POA: Diagnosis not present

## 2018-06-16 DIAGNOSIS — E785 Hyperlipidemia, unspecified: Secondary | ICD-10-CM | POA: Diagnosis not present

## 2018-06-16 DIAGNOSIS — I519 Heart disease, unspecified: Secondary | ICD-10-CM | POA: Diagnosis not present

## 2018-06-16 DIAGNOSIS — Z8249 Family history of ischemic heart disease and other diseases of the circulatory system: Secondary | ICD-10-CM | POA: Diagnosis not present

## 2018-06-16 DIAGNOSIS — R079 Chest pain, unspecified: Secondary | ICD-10-CM

## 2018-06-16 DIAGNOSIS — R931 Abnormal findings on diagnostic imaging of heart and coronary circulation: Secondary | ICD-10-CM | POA: Diagnosis not present

## 2018-07-20 DIAGNOSIS — J449 Chronic obstructive pulmonary disease, unspecified: Secondary | ICD-10-CM | POA: Diagnosis not present

## 2018-07-20 DIAGNOSIS — E782 Mixed hyperlipidemia: Secondary | ICD-10-CM | POA: Diagnosis not present

## 2018-07-20 DIAGNOSIS — K210 Gastro-esophageal reflux disease with esophagitis: Secondary | ICD-10-CM | POA: Diagnosis not present

## 2018-07-20 DIAGNOSIS — J441 Chronic obstructive pulmonary disease with (acute) exacerbation: Secondary | ICD-10-CM | POA: Diagnosis not present

## 2018-08-06 DIAGNOSIS — H353132 Nonexudative age-related macular degeneration, bilateral, intermediate dry stage: Secondary | ICD-10-CM | POA: Diagnosis not present

## 2018-08-19 DIAGNOSIS — K210 Gastro-esophageal reflux disease with esophagitis: Secondary | ICD-10-CM | POA: Diagnosis not present

## 2018-08-19 DIAGNOSIS — E782 Mixed hyperlipidemia: Secondary | ICD-10-CM | POA: Diagnosis not present

## 2018-08-19 DIAGNOSIS — J449 Chronic obstructive pulmonary disease, unspecified: Secondary | ICD-10-CM | POA: Diagnosis not present

## 2018-08-19 DIAGNOSIS — J441 Chronic obstructive pulmonary disease with (acute) exacerbation: Secondary | ICD-10-CM | POA: Diagnosis not present

## 2018-09-17 DIAGNOSIS — E782 Mixed hyperlipidemia: Secondary | ICD-10-CM | POA: Diagnosis not present

## 2018-09-17 DIAGNOSIS — J449 Chronic obstructive pulmonary disease, unspecified: Secondary | ICD-10-CM | POA: Diagnosis not present

## 2018-09-17 DIAGNOSIS — J441 Chronic obstructive pulmonary disease with (acute) exacerbation: Secondary | ICD-10-CM | POA: Diagnosis not present

## 2018-09-17 DIAGNOSIS — K210 Gastro-esophageal reflux disease with esophagitis: Secondary | ICD-10-CM | POA: Diagnosis not present

## 2018-10-01 DIAGNOSIS — E782 Mixed hyperlipidemia: Secondary | ICD-10-CM | POA: Diagnosis not present

## 2018-10-08 DIAGNOSIS — D692 Other nonthrombocytopenic purpura: Secondary | ICD-10-CM | POA: Diagnosis not present

## 2018-10-08 DIAGNOSIS — E782 Mixed hyperlipidemia: Secondary | ICD-10-CM | POA: Diagnosis not present

## 2018-10-08 DIAGNOSIS — F339 Major depressive disorder, recurrent, unspecified: Secondary | ICD-10-CM | POA: Diagnosis not present

## 2018-10-08 DIAGNOSIS — M810 Age-related osteoporosis without current pathological fracture: Secondary | ICD-10-CM | POA: Diagnosis not present

## 2018-10-08 DIAGNOSIS — B370 Candidal stomatitis: Secondary | ICD-10-CM | POA: Diagnosis not present

## 2018-10-19 DIAGNOSIS — F339 Major depressive disorder, recurrent, unspecified: Secondary | ICD-10-CM | POA: Diagnosis not present

## 2018-10-19 DIAGNOSIS — E782 Mixed hyperlipidemia: Secondary | ICD-10-CM | POA: Diagnosis not present

## 2018-10-19 DIAGNOSIS — M810 Age-related osteoporosis without current pathological fracture: Secondary | ICD-10-CM | POA: Diagnosis not present

## 2018-10-19 DIAGNOSIS — J449 Chronic obstructive pulmonary disease, unspecified: Secondary | ICD-10-CM | POA: Diagnosis not present

## 2018-10-26 DIAGNOSIS — L570 Actinic keratosis: Secondary | ICD-10-CM | POA: Diagnosis not present

## 2018-10-26 DIAGNOSIS — L821 Other seborrheic keratosis: Secondary | ICD-10-CM | POA: Diagnosis not present

## 2018-10-26 DIAGNOSIS — D225 Melanocytic nevi of trunk: Secondary | ICD-10-CM | POA: Diagnosis not present

## 2018-10-26 DIAGNOSIS — Z85828 Personal history of other malignant neoplasm of skin: Secondary | ICD-10-CM | POA: Diagnosis not present

## 2018-10-26 DIAGNOSIS — L814 Other melanin hyperpigmentation: Secondary | ICD-10-CM | POA: Diagnosis not present

## 2018-10-26 DIAGNOSIS — L820 Inflamed seborrheic keratosis: Secondary | ICD-10-CM | POA: Diagnosis not present

## 2018-10-26 DIAGNOSIS — D1801 Hemangioma of skin and subcutaneous tissue: Secondary | ICD-10-CM | POA: Diagnosis not present

## 2018-11-03 DIAGNOSIS — M810 Age-related osteoporosis without current pathological fracture: Secondary | ICD-10-CM | POA: Diagnosis not present

## 2018-11-19 DIAGNOSIS — M810 Age-related osteoporosis without current pathological fracture: Secondary | ICD-10-CM | POA: Diagnosis not present

## 2018-11-19 DIAGNOSIS — J449 Chronic obstructive pulmonary disease, unspecified: Secondary | ICD-10-CM | POA: Diagnosis not present

## 2018-11-19 DIAGNOSIS — F339 Major depressive disorder, recurrent, unspecified: Secondary | ICD-10-CM | POA: Diagnosis not present

## 2018-11-19 DIAGNOSIS — E782 Mixed hyperlipidemia: Secondary | ICD-10-CM | POA: Diagnosis not present

## 2018-12-20 DIAGNOSIS — E782 Mixed hyperlipidemia: Secondary | ICD-10-CM | POA: Diagnosis not present

## 2018-12-20 DIAGNOSIS — F339 Major depressive disorder, recurrent, unspecified: Secondary | ICD-10-CM | POA: Diagnosis not present

## 2018-12-20 DIAGNOSIS — K210 Gastro-esophageal reflux disease with esophagitis: Secondary | ICD-10-CM | POA: Diagnosis not present

## 2018-12-20 DIAGNOSIS — J449 Chronic obstructive pulmonary disease, unspecified: Secondary | ICD-10-CM | POA: Diagnosis not present

## 2019-01-04 DIAGNOSIS — H353132 Nonexudative age-related macular degeneration, bilateral, intermediate dry stage: Secondary | ICD-10-CM | POA: Diagnosis not present

## 2019-01-04 DIAGNOSIS — H04123 Dry eye syndrome of bilateral lacrimal glands: Secondary | ICD-10-CM | POA: Diagnosis not present

## 2019-01-04 DIAGNOSIS — Z961 Presence of intraocular lens: Secondary | ICD-10-CM | POA: Diagnosis not present

## 2019-01-10 DIAGNOSIS — M25512 Pain in left shoulder: Secondary | ICD-10-CM | POA: Diagnosis not present

## 2019-01-10 DIAGNOSIS — Z6825 Body mass index (BMI) 25.0-25.9, adult: Secondary | ICD-10-CM | POA: Diagnosis not present

## 2019-01-19 DIAGNOSIS — J449 Chronic obstructive pulmonary disease, unspecified: Secondary | ICD-10-CM | POA: Diagnosis not present

## 2019-01-19 DIAGNOSIS — M799 Soft tissue disorder, unspecified: Secondary | ICD-10-CM | POA: Diagnosis not present

## 2019-01-19 DIAGNOSIS — M6281 Muscle weakness (generalized): Secondary | ICD-10-CM | POA: Diagnosis not present

## 2019-01-19 DIAGNOSIS — F339 Major depressive disorder, recurrent, unspecified: Secondary | ICD-10-CM | POA: Diagnosis not present

## 2019-01-19 DIAGNOSIS — K210 Gastro-esophageal reflux disease with esophagitis: Secondary | ICD-10-CM | POA: Diagnosis not present

## 2019-01-19 DIAGNOSIS — E782 Mixed hyperlipidemia: Secondary | ICD-10-CM | POA: Diagnosis not present

## 2019-01-19 DIAGNOSIS — M25512 Pain in left shoulder: Secondary | ICD-10-CM | POA: Diagnosis not present

## 2019-01-19 DIAGNOSIS — M25612 Stiffness of left shoulder, not elsewhere classified: Secondary | ICD-10-CM | POA: Diagnosis not present

## 2019-01-21 DIAGNOSIS — M799 Soft tissue disorder, unspecified: Secondary | ICD-10-CM | POA: Diagnosis not present

## 2019-01-21 DIAGNOSIS — M25512 Pain in left shoulder: Secondary | ICD-10-CM | POA: Diagnosis not present

## 2019-01-21 DIAGNOSIS — M25612 Stiffness of left shoulder, not elsewhere classified: Secondary | ICD-10-CM | POA: Diagnosis not present

## 2019-01-21 DIAGNOSIS — M6281 Muscle weakness (generalized): Secondary | ICD-10-CM | POA: Diagnosis not present

## 2019-01-26 DIAGNOSIS — M799 Soft tissue disorder, unspecified: Secondary | ICD-10-CM | POA: Diagnosis not present

## 2019-01-26 DIAGNOSIS — M25612 Stiffness of left shoulder, not elsewhere classified: Secondary | ICD-10-CM | POA: Diagnosis not present

## 2019-01-26 DIAGNOSIS — M25512 Pain in left shoulder: Secondary | ICD-10-CM | POA: Diagnosis not present

## 2019-01-26 DIAGNOSIS — M6281 Muscle weakness (generalized): Secondary | ICD-10-CM | POA: Diagnosis not present

## 2019-01-28 DIAGNOSIS — M25512 Pain in left shoulder: Secondary | ICD-10-CM | POA: Diagnosis not present

## 2019-01-28 DIAGNOSIS — M25612 Stiffness of left shoulder, not elsewhere classified: Secondary | ICD-10-CM | POA: Diagnosis not present

## 2019-01-28 DIAGNOSIS — M6281 Muscle weakness (generalized): Secondary | ICD-10-CM | POA: Diagnosis not present

## 2019-01-28 DIAGNOSIS — M799 Soft tissue disorder, unspecified: Secondary | ICD-10-CM | POA: Diagnosis not present

## 2019-02-02 DIAGNOSIS — M25512 Pain in left shoulder: Secondary | ICD-10-CM | POA: Diagnosis not present

## 2019-02-02 DIAGNOSIS — M799 Soft tissue disorder, unspecified: Secondary | ICD-10-CM | POA: Diagnosis not present

## 2019-02-02 DIAGNOSIS — M25612 Stiffness of left shoulder, not elsewhere classified: Secondary | ICD-10-CM | POA: Diagnosis not present

## 2019-02-02 DIAGNOSIS — M6281 Muscle weakness (generalized): Secondary | ICD-10-CM | POA: Diagnosis not present

## 2019-02-04 DIAGNOSIS — M6281 Muscle weakness (generalized): Secondary | ICD-10-CM | POA: Diagnosis not present

## 2019-02-04 DIAGNOSIS — M25512 Pain in left shoulder: Secondary | ICD-10-CM | POA: Diagnosis not present

## 2019-02-04 DIAGNOSIS — M25612 Stiffness of left shoulder, not elsewhere classified: Secondary | ICD-10-CM | POA: Diagnosis not present

## 2019-02-04 DIAGNOSIS — M799 Soft tissue disorder, unspecified: Secondary | ICD-10-CM | POA: Diagnosis not present

## 2019-02-09 DIAGNOSIS — M6281 Muscle weakness (generalized): Secondary | ICD-10-CM | POA: Diagnosis not present

## 2019-02-09 DIAGNOSIS — M25512 Pain in left shoulder: Secondary | ICD-10-CM | POA: Diagnosis not present

## 2019-02-09 DIAGNOSIS — M25612 Stiffness of left shoulder, not elsewhere classified: Secondary | ICD-10-CM | POA: Diagnosis not present

## 2019-02-09 DIAGNOSIS — M799 Soft tissue disorder, unspecified: Secondary | ICD-10-CM | POA: Diagnosis not present

## 2019-02-18 DIAGNOSIS — E782 Mixed hyperlipidemia: Secondary | ICD-10-CM | POA: Diagnosis not present

## 2019-02-18 DIAGNOSIS — M810 Age-related osteoporosis without current pathological fracture: Secondary | ICD-10-CM | POA: Diagnosis not present

## 2019-02-18 DIAGNOSIS — J449 Chronic obstructive pulmonary disease, unspecified: Secondary | ICD-10-CM | POA: Diagnosis not present

## 2019-03-04 DIAGNOSIS — R300 Dysuria: Secondary | ICD-10-CM | POA: Diagnosis not present

## 2019-03-09 DIAGNOSIS — Z6824 Body mass index (BMI) 24.0-24.9, adult: Secondary | ICD-10-CM | POA: Diagnosis not present

## 2019-03-09 DIAGNOSIS — K5900 Constipation, unspecified: Secondary | ICD-10-CM | POA: Diagnosis not present

## 2019-03-09 DIAGNOSIS — B370 Candidal stomatitis: Secondary | ICD-10-CM | POA: Diagnosis not present

## 2019-03-10 DIAGNOSIS — R911 Solitary pulmonary nodule: Secondary | ICD-10-CM | POA: Diagnosis not present

## 2019-03-21 DIAGNOSIS — E782 Mixed hyperlipidemia: Secondary | ICD-10-CM | POA: Diagnosis not present

## 2019-03-21 DIAGNOSIS — J449 Chronic obstructive pulmonary disease, unspecified: Secondary | ICD-10-CM | POA: Diagnosis not present

## 2019-03-21 DIAGNOSIS — M810 Age-related osteoporosis without current pathological fracture: Secondary | ICD-10-CM | POA: Diagnosis not present

## 2019-03-24 DIAGNOSIS — R32 Unspecified urinary incontinence: Secondary | ICD-10-CM | POA: Diagnosis not present

## 2019-03-24 DIAGNOSIS — Z6824 Body mass index (BMI) 24.0-24.9, adult: Secondary | ICD-10-CM | POA: Diagnosis not present

## 2019-03-24 DIAGNOSIS — E782 Mixed hyperlipidemia: Secondary | ICD-10-CM | POA: Diagnosis not present

## 2019-03-24 DIAGNOSIS — D692 Other nonthrombocytopenic purpura: Secondary | ICD-10-CM | POA: Diagnosis not present

## 2019-03-31 DIAGNOSIS — F339 Major depressive disorder, recurrent, unspecified: Secondary | ICD-10-CM | POA: Diagnosis not present

## 2019-03-31 DIAGNOSIS — J449 Chronic obstructive pulmonary disease, unspecified: Secondary | ICD-10-CM | POA: Diagnosis not present

## 2019-03-31 DIAGNOSIS — E782 Mixed hyperlipidemia: Secondary | ICD-10-CM | POA: Diagnosis not present

## 2019-03-31 DIAGNOSIS — I700 Atherosclerosis of aorta: Secondary | ICD-10-CM | POA: Diagnosis not present

## 2019-04-20 DIAGNOSIS — J449 Chronic obstructive pulmonary disease, unspecified: Secondary | ICD-10-CM | POA: Diagnosis not present

## 2019-04-20 DIAGNOSIS — F339 Major depressive disorder, recurrent, unspecified: Secondary | ICD-10-CM | POA: Diagnosis not present

## 2019-04-20 DIAGNOSIS — I700 Atherosclerosis of aorta: Secondary | ICD-10-CM | POA: Diagnosis not present

## 2019-04-20 DIAGNOSIS — E782 Mixed hyperlipidemia: Secondary | ICD-10-CM | POA: Diagnosis not present

## 2019-05-03 DIAGNOSIS — N952 Postmenopausal atrophic vaginitis: Secondary | ICD-10-CM | POA: Diagnosis not present

## 2019-05-03 DIAGNOSIS — N3281 Overactive bladder: Secondary | ICD-10-CM | POA: Diagnosis not present

## 2019-05-03 DIAGNOSIS — N3090 Cystitis, unspecified without hematuria: Secondary | ICD-10-CM | POA: Diagnosis not present

## 2019-05-04 DIAGNOSIS — Z20822 Contact with and (suspected) exposure to covid-19: Secondary | ICD-10-CM | POA: Diagnosis not present

## 2019-05-11 DIAGNOSIS — Z1331 Encounter for screening for depression: Secondary | ICD-10-CM | POA: Diagnosis not present

## 2019-05-11 DIAGNOSIS — Z0000 Encounter for general adult medical examination without abnormal findings: Secondary | ICD-10-CM | POA: Diagnosis not present

## 2019-05-11 DIAGNOSIS — Z1339 Encounter for screening examination for other mental health and behavioral disorders: Secondary | ICD-10-CM | POA: Diagnosis not present

## 2019-05-11 DIAGNOSIS — Z7189 Other specified counseling: Secondary | ICD-10-CM | POA: Diagnosis not present

## 2019-05-11 DIAGNOSIS — Z136 Encounter for screening for cardiovascular disorders: Secondary | ICD-10-CM | POA: Diagnosis not present

## 2019-05-11 DIAGNOSIS — Z139 Encounter for screening, unspecified: Secondary | ICD-10-CM | POA: Diagnosis not present

## 2019-05-20 DIAGNOSIS — I700 Atherosclerosis of aorta: Secondary | ICD-10-CM | POA: Diagnosis not present

## 2019-05-20 DIAGNOSIS — E782 Mixed hyperlipidemia: Secondary | ICD-10-CM | POA: Diagnosis not present

## 2019-05-20 DIAGNOSIS — F339 Major depressive disorder, recurrent, unspecified: Secondary | ICD-10-CM | POA: Diagnosis not present

## 2019-05-20 DIAGNOSIS — J449 Chronic obstructive pulmonary disease, unspecified: Secondary | ICD-10-CM | POA: Diagnosis not present

## 2019-06-08 DIAGNOSIS — K222 Esophageal obstruction: Secondary | ICD-10-CM | POA: Diagnosis not present

## 2019-06-08 DIAGNOSIS — R002 Palpitations: Secondary | ICD-10-CM | POA: Diagnosis not present

## 2019-06-08 DIAGNOSIS — K219 Gastro-esophageal reflux disease without esophagitis: Secondary | ICD-10-CM | POA: Diagnosis not present

## 2019-06-08 DIAGNOSIS — R142 Eructation: Secondary | ICD-10-CM | POA: Diagnosis not present

## 2019-06-16 DIAGNOSIS — K222 Esophageal obstruction: Secondary | ICD-10-CM | POA: Diagnosis not present

## 2019-06-16 DIAGNOSIS — J449 Chronic obstructive pulmonary disease, unspecified: Secondary | ICD-10-CM | POA: Diagnosis not present

## 2019-06-16 DIAGNOSIS — R142 Eructation: Secondary | ICD-10-CM | POA: Diagnosis not present

## 2019-06-16 DIAGNOSIS — K219 Gastro-esophageal reflux disease without esophagitis: Secondary | ICD-10-CM | POA: Diagnosis not present

## 2019-06-19 DIAGNOSIS — E782 Mixed hyperlipidemia: Secondary | ICD-10-CM | POA: Diagnosis not present

## 2019-06-19 DIAGNOSIS — F339 Major depressive disorder, recurrent, unspecified: Secondary | ICD-10-CM | POA: Diagnosis not present

## 2019-06-19 DIAGNOSIS — J449 Chronic obstructive pulmonary disease, unspecified: Secondary | ICD-10-CM | POA: Diagnosis not present

## 2019-06-19 DIAGNOSIS — K219 Gastro-esophageal reflux disease without esophagitis: Secondary | ICD-10-CM | POA: Diagnosis not present

## 2019-07-07 DIAGNOSIS — L570 Actinic keratosis: Secondary | ICD-10-CM | POA: Diagnosis not present

## 2019-07-18 DIAGNOSIS — Z1231 Encounter for screening mammogram for malignant neoplasm of breast: Secondary | ICD-10-CM | POA: Diagnosis not present

## 2019-07-20 DIAGNOSIS — J449 Chronic obstructive pulmonary disease, unspecified: Secondary | ICD-10-CM | POA: Diagnosis not present

## 2019-07-20 DIAGNOSIS — F339 Major depressive disorder, recurrent, unspecified: Secondary | ICD-10-CM | POA: Diagnosis not present

## 2019-07-20 DIAGNOSIS — K219 Gastro-esophageal reflux disease without esophagitis: Secondary | ICD-10-CM | POA: Diagnosis not present

## 2019-07-20 DIAGNOSIS — E782 Mixed hyperlipidemia: Secondary | ICD-10-CM | POA: Diagnosis not present

## 2019-08-19 DIAGNOSIS — K219 Gastro-esophageal reflux disease without esophagitis: Secondary | ICD-10-CM | POA: Diagnosis not present

## 2019-08-19 DIAGNOSIS — J449 Chronic obstructive pulmonary disease, unspecified: Secondary | ICD-10-CM | POA: Diagnosis not present

## 2019-08-19 DIAGNOSIS — F339 Major depressive disorder, recurrent, unspecified: Secondary | ICD-10-CM | POA: Diagnosis not present

## 2019-08-19 DIAGNOSIS — E782 Mixed hyperlipidemia: Secondary | ICD-10-CM | POA: Diagnosis not present

## 2019-09-05 DIAGNOSIS — H1031 Unspecified acute conjunctivitis, right eye: Secondary | ICD-10-CM | POA: Diagnosis not present

## 2019-09-09 DIAGNOSIS — R82998 Other abnormal findings in urine: Secondary | ICD-10-CM | POA: Diagnosis not present

## 2019-09-09 DIAGNOSIS — R300 Dysuria: Secondary | ICD-10-CM | POA: Diagnosis not present

## 2019-09-14 DIAGNOSIS — E782 Mixed hyperlipidemia: Secondary | ICD-10-CM | POA: Diagnosis not present

## 2019-09-19 DIAGNOSIS — J449 Chronic obstructive pulmonary disease, unspecified: Secondary | ICD-10-CM | POA: Diagnosis not present

## 2019-09-19 DIAGNOSIS — K219 Gastro-esophageal reflux disease without esophagitis: Secondary | ICD-10-CM | POA: Diagnosis not present

## 2019-09-19 DIAGNOSIS — E782 Mixed hyperlipidemia: Secondary | ICD-10-CM | POA: Diagnosis not present

## 2019-09-19 DIAGNOSIS — F339 Major depressive disorder, recurrent, unspecified: Secondary | ICD-10-CM | POA: Diagnosis not present

## 2019-09-22 DIAGNOSIS — E782 Mixed hyperlipidemia: Secondary | ICD-10-CM | POA: Diagnosis not present

## 2019-09-22 DIAGNOSIS — F339 Major depressive disorder, recurrent, unspecified: Secondary | ICD-10-CM | POA: Diagnosis not present

## 2019-09-22 DIAGNOSIS — D692 Other nonthrombocytopenic purpura: Secondary | ICD-10-CM | POA: Diagnosis not present

## 2019-09-22 DIAGNOSIS — M545 Low back pain: Secondary | ICD-10-CM | POA: Diagnosis not present

## 2019-10-19 DIAGNOSIS — J449 Chronic obstructive pulmonary disease, unspecified: Secondary | ICD-10-CM | POA: Diagnosis not present

## 2019-10-19 DIAGNOSIS — F339 Major depressive disorder, recurrent, unspecified: Secondary | ICD-10-CM | POA: Diagnosis not present

## 2019-10-19 DIAGNOSIS — K219 Gastro-esophageal reflux disease without esophagitis: Secondary | ICD-10-CM | POA: Diagnosis not present

## 2019-10-19 DIAGNOSIS — E782 Mixed hyperlipidemia: Secondary | ICD-10-CM | POA: Diagnosis not present

## 2019-10-27 DIAGNOSIS — L570 Actinic keratosis: Secondary | ICD-10-CM | POA: Diagnosis not present

## 2019-10-27 DIAGNOSIS — D225 Melanocytic nevi of trunk: Secondary | ICD-10-CM | POA: Diagnosis not present

## 2019-10-27 DIAGNOSIS — L820 Inflamed seborrheic keratosis: Secondary | ICD-10-CM | POA: Diagnosis not present

## 2019-10-27 DIAGNOSIS — L821 Other seborrheic keratosis: Secondary | ICD-10-CM | POA: Diagnosis not present

## 2019-10-27 DIAGNOSIS — L814 Other melanin hyperpigmentation: Secondary | ICD-10-CM | POA: Diagnosis not present

## 2019-10-31 DIAGNOSIS — N3090 Cystitis, unspecified without hematuria: Secondary | ICD-10-CM | POA: Diagnosis not present

## 2019-10-31 DIAGNOSIS — N952 Postmenopausal atrophic vaginitis: Secondary | ICD-10-CM | POA: Diagnosis not present

## 2019-10-31 DIAGNOSIS — N3281 Overactive bladder: Secondary | ICD-10-CM | POA: Diagnosis not present

## 2019-10-31 DIAGNOSIS — M810 Age-related osteoporosis without current pathological fracture: Secondary | ICD-10-CM | POA: Diagnosis not present

## 2019-11-07 DIAGNOSIS — H6120 Impacted cerumen, unspecified ear: Secondary | ICD-10-CM | POA: Diagnosis not present

## 2019-11-07 DIAGNOSIS — Z6825 Body mass index (BMI) 25.0-25.9, adult: Secondary | ICD-10-CM | POA: Diagnosis not present

## 2019-11-20 DIAGNOSIS — M810 Age-related osteoporosis without current pathological fracture: Secondary | ICD-10-CM | POA: Diagnosis not present

## 2019-11-20 DIAGNOSIS — K219 Gastro-esophageal reflux disease without esophagitis: Secondary | ICD-10-CM | POA: Diagnosis not present

## 2019-11-20 DIAGNOSIS — E782 Mixed hyperlipidemia: Secondary | ICD-10-CM | POA: Diagnosis not present

## 2019-11-20 DIAGNOSIS — F339 Major depressive disorder, recurrent, unspecified: Secondary | ICD-10-CM | POA: Diagnosis not present

## 2019-12-20 DIAGNOSIS — F339 Major depressive disorder, recurrent, unspecified: Secondary | ICD-10-CM | POA: Diagnosis not present

## 2019-12-20 DIAGNOSIS — E782 Mixed hyperlipidemia: Secondary | ICD-10-CM | POA: Diagnosis not present

## 2019-12-20 DIAGNOSIS — K219 Gastro-esophageal reflux disease without esophagitis: Secondary | ICD-10-CM | POA: Diagnosis not present

## 2019-12-20 DIAGNOSIS — M810 Age-related osteoporosis without current pathological fracture: Secondary | ICD-10-CM | POA: Diagnosis not present

## 2019-12-21 DIAGNOSIS — E782 Mixed hyperlipidemia: Secondary | ICD-10-CM | POA: Diagnosis not present

## 2019-12-21 DIAGNOSIS — M810 Age-related osteoporosis without current pathological fracture: Secondary | ICD-10-CM | POA: Diagnosis not present

## 2019-12-21 DIAGNOSIS — F339 Major depressive disorder, recurrent, unspecified: Secondary | ICD-10-CM | POA: Diagnosis not present

## 2019-12-21 DIAGNOSIS — K219 Gastro-esophageal reflux disease without esophagitis: Secondary | ICD-10-CM | POA: Diagnosis not present

## 2020-01-09 DIAGNOSIS — H04123 Dry eye syndrome of bilateral lacrimal glands: Secondary | ICD-10-CM | POA: Diagnosis not present

## 2020-01-09 DIAGNOSIS — K219 Gastro-esophageal reflux disease without esophagitis: Secondary | ICD-10-CM | POA: Diagnosis not present

## 2020-01-09 DIAGNOSIS — Z23 Encounter for immunization: Secondary | ICD-10-CM | POA: Diagnosis not present

## 2020-01-09 DIAGNOSIS — K5904 Chronic idiopathic constipation: Secondary | ICD-10-CM | POA: Diagnosis not present

## 2020-01-09 DIAGNOSIS — K222 Esophageal obstruction: Secondary | ICD-10-CM | POA: Diagnosis not present

## 2020-01-13 DIAGNOSIS — B079 Viral wart, unspecified: Secondary | ICD-10-CM | POA: Diagnosis not present

## 2020-01-13 DIAGNOSIS — D485 Neoplasm of uncertain behavior of skin: Secondary | ICD-10-CM | POA: Diagnosis not present

## 2020-01-20 DIAGNOSIS — K219 Gastro-esophageal reflux disease without esophagitis: Secondary | ICD-10-CM | POA: Diagnosis not present

## 2020-01-20 DIAGNOSIS — K5904 Chronic idiopathic constipation: Secondary | ICD-10-CM | POA: Diagnosis not present

## 2020-01-20 DIAGNOSIS — K222 Esophageal obstruction: Secondary | ICD-10-CM | POA: Diagnosis not present

## 2020-01-20 DIAGNOSIS — M810 Age-related osteoporosis without current pathological fracture: Secondary | ICD-10-CM | POA: Diagnosis not present

## 2020-01-25 ENCOUNTER — Encounter: Payer: Self-pay | Admitting: Gastroenterology

## 2020-02-17 DIAGNOSIS — G8929 Other chronic pain: Secondary | ICD-10-CM | POA: Diagnosis not present

## 2020-02-17 DIAGNOSIS — Z139 Encounter for screening, unspecified: Secondary | ICD-10-CM | POA: Diagnosis not present

## 2020-02-17 DIAGNOSIS — M545 Low back pain, unspecified: Secondary | ICD-10-CM | POA: Diagnosis not present

## 2020-02-17 DIAGNOSIS — M546 Pain in thoracic spine: Secondary | ICD-10-CM | POA: Diagnosis not present

## 2020-02-20 DIAGNOSIS — K219 Gastro-esophageal reflux disease without esophagitis: Secondary | ICD-10-CM | POA: Diagnosis not present

## 2020-02-20 DIAGNOSIS — M810 Age-related osteoporosis without current pathological fracture: Secondary | ICD-10-CM | POA: Diagnosis not present

## 2020-02-20 DIAGNOSIS — K5904 Chronic idiopathic constipation: Secondary | ICD-10-CM | POA: Diagnosis not present

## 2020-02-20 DIAGNOSIS — K222 Esophageal obstruction: Secondary | ICD-10-CM | POA: Diagnosis not present

## 2020-03-21 DIAGNOSIS — K222 Esophageal obstruction: Secondary | ICD-10-CM | POA: Diagnosis not present

## 2020-03-21 DIAGNOSIS — M810 Age-related osteoporosis without current pathological fracture: Secondary | ICD-10-CM | POA: Diagnosis not present

## 2020-03-21 DIAGNOSIS — K219 Gastro-esophageal reflux disease without esophagitis: Secondary | ICD-10-CM | POA: Diagnosis not present

## 2020-03-21 DIAGNOSIS — K5904 Chronic idiopathic constipation: Secondary | ICD-10-CM | POA: Diagnosis not present

## 2020-03-22 ENCOUNTER — Ambulatory Visit: Payer: Medicare HMO | Admitting: Gastroenterology

## 2020-03-22 ENCOUNTER — Encounter: Payer: Self-pay | Admitting: Gastroenterology

## 2020-03-22 VITALS — BP 112/74 | HR 97 | Ht 65.0 in | Wt 143.2 lb

## 2020-03-22 DIAGNOSIS — R1310 Dysphagia, unspecified: Secondary | ICD-10-CM | POA: Diagnosis not present

## 2020-03-22 DIAGNOSIS — K219 Gastro-esophageal reflux disease without esophagitis: Secondary | ICD-10-CM | POA: Diagnosis not present

## 2020-03-22 MED ORDER — OMEPRAZOLE 40 MG PO CPDR: 40.0000 mg | DELAYED_RELEASE_CAPSULE | Freq: Every day | ORAL | 3 refills | Status: DC

## 2020-03-22 MED ORDER — OMEPRAZOLE 20 MG PO CPDR: 20.0000 mg | DELAYED_RELEASE_CAPSULE | Freq: Two times a day (BID) | ORAL | 3 refills | Status: DC

## 2020-03-22 MED ORDER — OMEPRAZOLE 40 MG PO CPDR: 40.0000 mg | DELAYED_RELEASE_CAPSULE | Freq: Every day | ORAL | 11 refills | Status: DC

## 2020-03-22 NOTE — Patient Instructions (Addendum)
If you are age 83 or older, your body mass index should be between 23-30. Your Body mass index is 23.84 kg/m. If this is out of the aforementioned range listed, please consider follow up with your Primary Care Provider.  If you are age 82 or younger, your body mass index should be between 19-25. Your Body mass index is 23.84 kg/m. If this is out of the aformentioned range listed, please consider follow up with your Primary Care Provider.   We have sent the following medications to your pharmacy for you to pick up at your convenience: Omeprazole   You have been scheduled for an endoscopy. Please follow written instructions given to you at your visit today. If you use inhalers (even only as needed), please bring them with you on the day of your procedure.  You have been scheduled for a Barium Esophogram at [facility] (1st floor of the hospital) on 03/27/20 at 10:30am. Please arrive 15 minutes prior to your appointment for registration. Make certain not to have anything to eat or drink 3 hours prior to your test. If you need to reschedule for any reason, please contact radiology at [phone] to do so. __________________________________________________________________ A barium swallow is an examination that concentrates on views of the esophagus. This tends to be a double contrast exam (barium and two liquids which, when combined, create a gas to distend the wall of the oesophagus) or single contrast (non-ionic iodine based). The study is usually tailored to your symptoms so a good history is essential. Attention is paid during the study to the form, structure and configuration of the esophagus, looking for functional disorders (such as aspiration, dysphagia, achalasia, motility and reflux) EXAMINATION You may be asked to change into a gown, depending on the type of swallow being performed. A radiologist and radiographer will perform the procedure. The radiologist will advise you of  the type of contrast selected for your procedure and direct you during the exam. You will be asked to stand, sit or lie in several different positions and to hold a small amount of fluid in your mouth before being asked to swallow while the imaging is performed .In some instances you may be asked to swallow barium coated marshmallows to assess the motility of a solid food bolus. The exam can be recorded as a digital or video fluoroscopy procedure. POST PROCEDURE It will take 1-2 days for the barium to pass through your system. To facilitate this, it is important, unless otherwise directed, to increase your fluids for the next 24-48hrs and to resume your normal diet.  This test typically takes about 30 minutes to perform. __________________________________________________________________________________  Thank you,  Dr. Jackquline Denmark

## 2020-03-22 NOTE — Addendum Note (Signed)
Addended by: Karena Addison on: 03/22/2020 03:51 PM   Modules accepted: Orders

## 2020-03-22 NOTE — Progress Notes (Signed)
Chief Complaint: Dysphagia  Referring Provider: Dr. Marco Collie      ASSESSMENT AND PLAN;   #1. GERD with eso dysphagia. D/d includes eso stricture, Schatzki's ring, motility disorder, EoE, pill induced esophagitis, r/o esophageal carcinoma/extrinsic lesions or Achalasia.  Plan: -Change Protonix to omeprazole 20 mg p.o. BID 1/2 hr before breakfast and dinner. #60.  11 refills. -Ba swallow with Ba tablet as well. -EGD with eso bx and possibly dil.  Discussed risks and benefits including small but definite risks of eso perforation, bleeding, risks of anesthesia.  The benefits were also discussed. Consent forms given. -I have instructed patient to chew foods especially meats and breads well and eat slowly.     HPI:    Tina [name] is a 85 y.o. female  Dysphagia x 3-4 months, mostly solids, mid chest, not much with liquids.  Progressively getting worse.  Has significant associated heartburn which has been worse over the last few months.  No significant odynophagia.  She does not have any problems taking pills.  Would occasionally have epigastric discomfort.  No melena or hematochezia.  No nonsteroidals.  Advised to get upper GI evaluation.  She has has been having heartburn despite Protonix.  Omeprazole worked better for her.  He denies having any significant diarrhea, constipation or lower abdominal pain.  No fever chills night sweats or recent weight loss.  Past GI procedures: -Colonoscopy 07/02/2011 ([facility]): Moderate sigmoid diverticulosis, small internal hemorrhoids.  Otherwise normal to TI.  No need to repeat unless problems.   Medications: Reviewed and scanned.  Past Medical History:  Diagnosis Date  . Back pain   . COPD (chronic obstructive pulmonary disease) ([place])   . GERD (gastroesophageal reflux disease)   . Hypercholesteremia   . IBS (irritable bowel syndrome)   . Urinary incontinence     Past Surgical History:  Procedure Laterality Date  . ABDOMINAL  HYSTERECTOMY    . BLADDER REPAIR    . CHOLECYSTECTOMY    . COLONOSCOPY  07/02/2011   Moderate sigmoid divertuculosis. Small internal hemorrhoids. Otherwise normal colonsocopy to terminal ileum    Family History  Problem Relation Age of Onset  . Breast cancer Mother   . Colon cancer Neg Hx   . Esophageal cancer Neg Hx     Social History   Tobacco Use  . Smoking status: Never Smoker  . Smokeless tobacco: Never Used  Vaping Use  . Vaping Use: Never used  Substance Use Topics  . Alcohol use: Not Currently  . Drug use: Never      Allergies  Allergen Reactions  . Cefdinir   . Levofloxacin   . Nitrofurantoin   . Sulfa Antibiotics     Review of Systems:  Constitutional: Denies fever, chills, diaphoresis, appetite change and fatigue.  HEENT: Denies photophobia, eye pain, redness, hearing loss, ear pain, congestion, sore throat, rhinorrhea, sneezing, mouth sores, neck pain, neck stiffness and tinnitus.   Respiratory: Denies SOB, DOE, cough, chest tightness,  and wheezing.   Cardiovascular: Denies chest pain, palpitations and leg swelling.  Genitourinary: Denies dysuria, urgency, frequency, hematuria, flank pain and difficulty urinating.  Musculoskeletal: has myalgias, back pain, joint swelling, arthralgias and gait problem.  Skin: No rash.  Neurological: Denies dizziness, seizures, syncope, weakness, light-headedness, numbness and headaches.  Hematological: Denies adenopathy. Easy bruising, personal or family bleeding history  Psychiatric/Behavioral: No anxiety or depression     Physical Exam:    BP 112/74   Pulse 97   Ht 5\' 5"  (1.651  m)   Wt 143 lb 4 oz (65 kg)   BMI 23.84 kg/m  Wt Readings from Last 3 Encounters:  03/22/20 143 lb 4 oz (65 kg)   Constitutional:  Well-developed, in no acute distress. Psychiatric: Normal mood and affect. Behavior is normal. HEENT: Pupils normal.  Conjunctivae are normal. No scleral icterus. Neck supple.  Cardiovascular: Normal  rate, regular rhythm. No edema Pulmonary/chest: Effort normal and breath sounds normal. No wheezing, rales or rhonchi. Abdominal: Soft, nondistended. Nontender. Bowel sounds active throughout. There are no masses palpable. No hepatomegaly. Rectal: Deferred Neurological: Alert and oriented to person place and time. Skin: Skin is warm and dry. No rashes noted.  Data Reviewed: I have personally reviewed following labs and imaging studies     Carmell Austria, MD 03/22/2020, 10:26 AM  Cc: Dr. Marco Collie.

## 2020-03-26 DIAGNOSIS — Z20822 Contact with and (suspected) exposure to covid-19: Secondary | ICD-10-CM | POA: Diagnosis not present

## 2020-03-27 ENCOUNTER — Ambulatory Visit (HOSPITAL_COMMUNITY)
Admission: RE | Admit: 2020-03-27 | Discharge: 2020-03-27 | Disposition: A | Discharge status: Home / Self Care | Payer: Medicare HMO | Source: Ambulatory Visit | Attending: Gastroenterology | Admitting: Gastroenterology

## 2020-03-27 ENCOUNTER — Other Ambulatory Visit: Payer: Self-pay

## 2020-03-27 DIAGNOSIS — K219 Gastro-esophageal reflux disease without esophagitis: Secondary | ICD-10-CM | POA: Diagnosis not present

## 2020-03-27 DIAGNOSIS — E782 Mixed hyperlipidemia: Secondary | ICD-10-CM | POA: Diagnosis not present

## 2020-03-27 DIAGNOSIS — R1310 Dysphagia, unspecified: Secondary | ICD-10-CM | POA: Diagnosis not present

## 2020-03-27 IMAGING — RF DG ESOPHAGUS
9 of 12 series · 13 of 18 positions shown · non-contrast
Comparison: None.

CLINICAL DATA: 85-year-old female with history of dysphagia.

EXAM:
ESOPHOGRAM / BARIUM SWALLOW / BARIUM TABLET STUDY
TECHNIQUE: Combined double contrast and single contrast examination performed
using effervescent crystals, thick barium liquid, and thin barium
liquid. The patient was observed with fluoroscopy swallowing a 13 mm
barium sulphate tablet.
FLUOROSCOPY TIME:  Fluoroscopy Time:  1.5 minutes
Radiation Exposure Index (if provided by the fluoroscopic device):
7.2 mGy

[Series 1: fluoro_barium 2fps_bw · 0.18mm/px · 1 of 1 slices shown (1 of 6)]
[im 1/1]
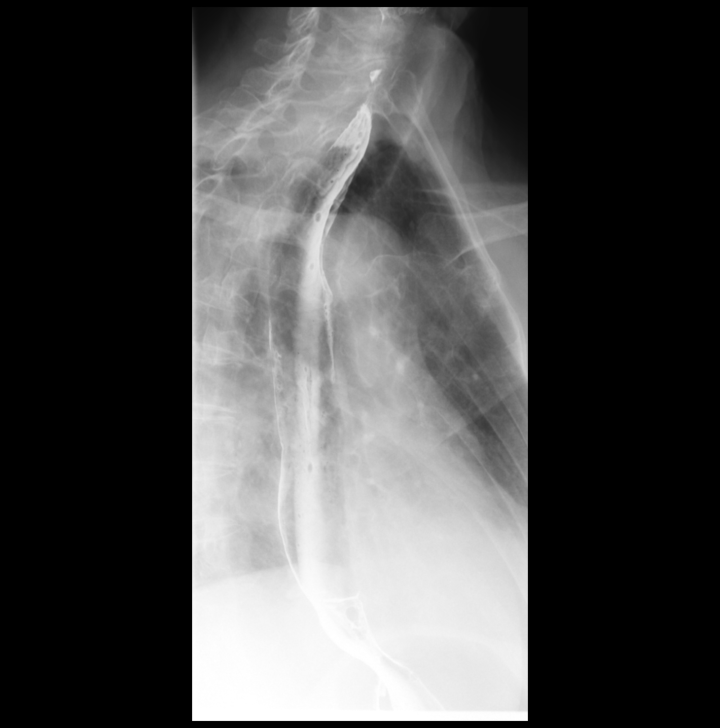

[Series 3: fluoro_barium 2fps_bw · 0.18mm/px · 1 of 1 slices shown (2 of 6)]
[im 1/1]
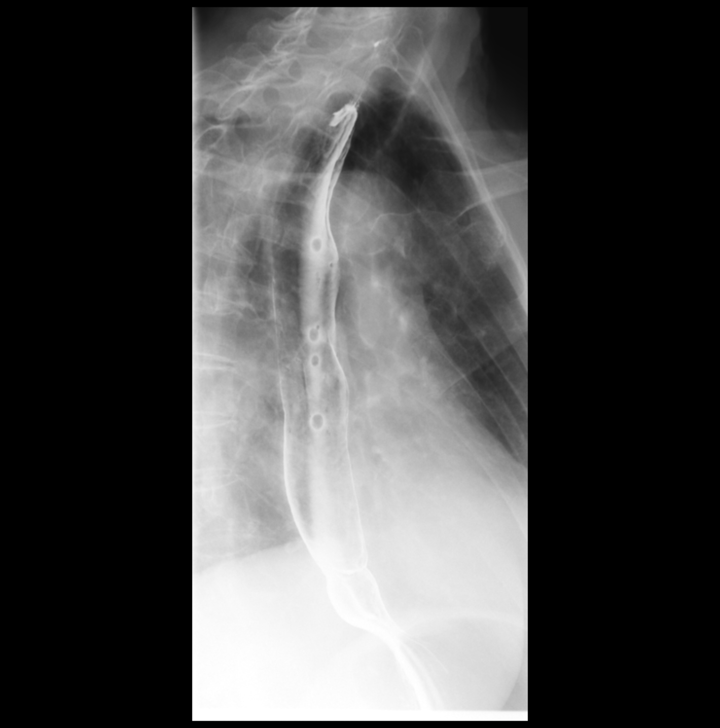

[Series 4: fluoro_barium 2fps_bw · 0.18mm/px · 1 of 1 slices shown (3 of 6)]
[im 1/1]
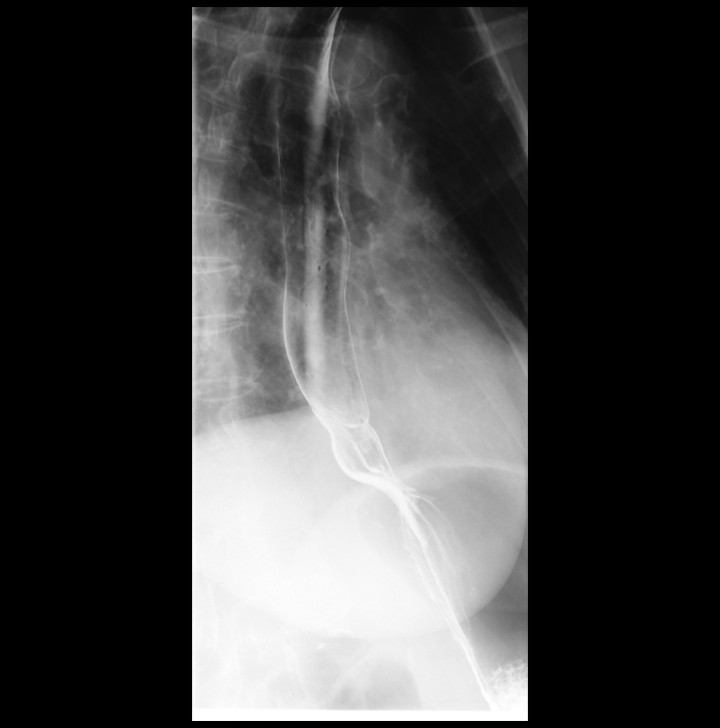

[Series 5: cp_standard · 0.53mm/px · 3 of 40 frames shown (1 of 3)]
[frame 7/40]
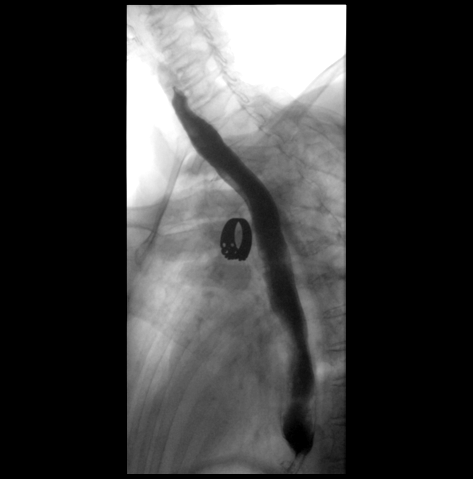
[frame 31/40]
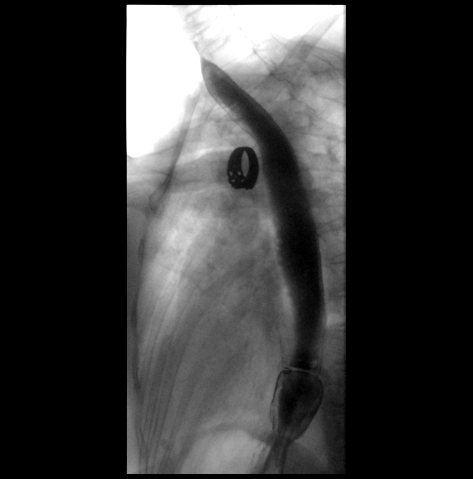
[frame 35/40]
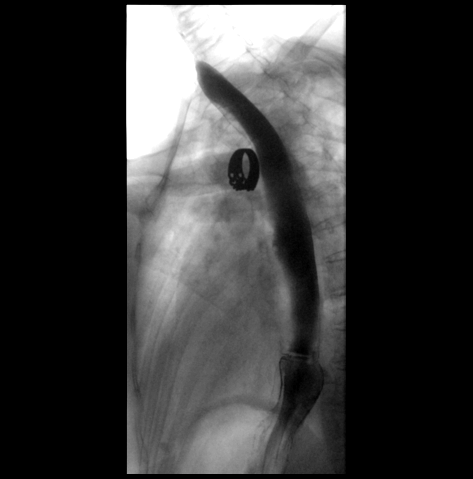

[Series 7: fluoro_barium 2fps_bw · 0.18mm/px · 1 of 1 slices shown (4 of 6)]
[im 1/1]
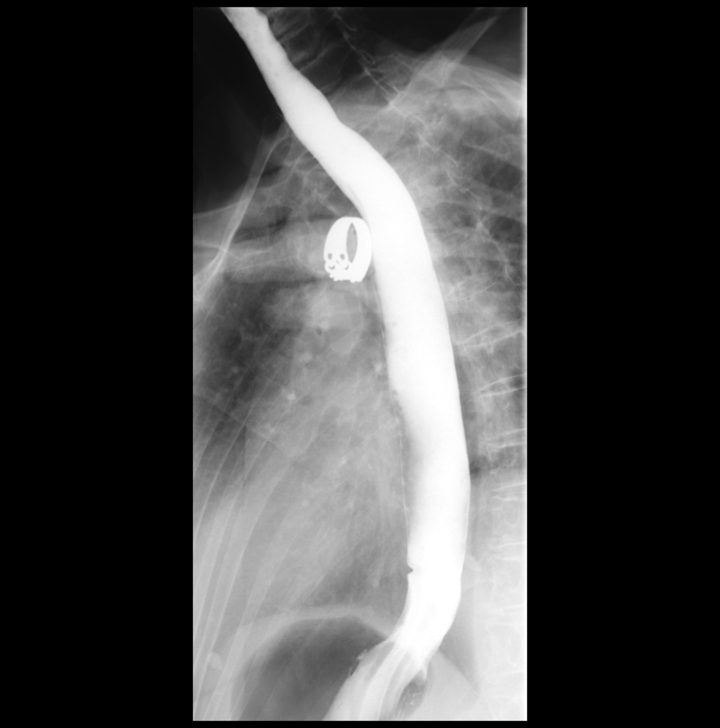

[Series 8: fluoro_barium 2fps_bw · 0.18mm/px · 1 of 1 slices shown (5 of 6)]
[im 1/1]
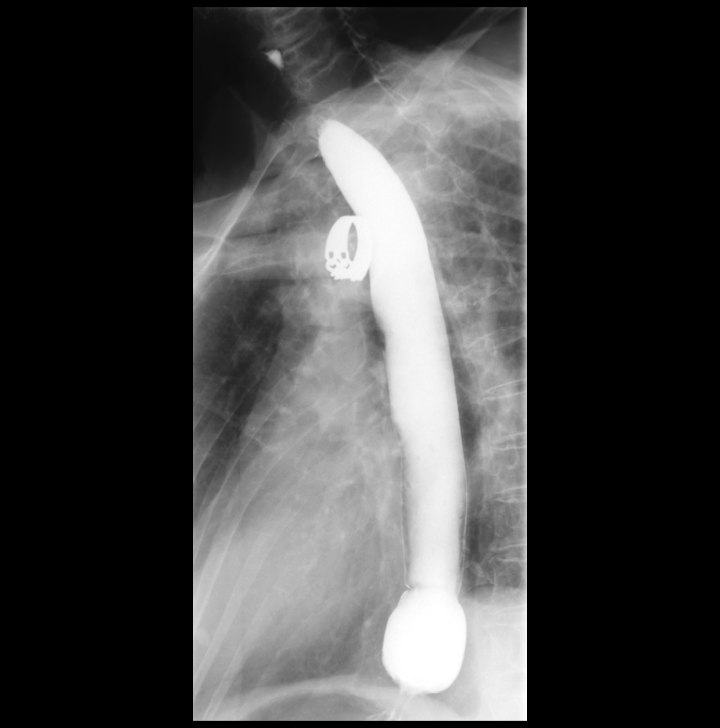

[Series 9: fluoro_barium 2fps_bw · 0.18mm/px · 1 of 1 slices shown (6 of 6)]
[im 1/1]
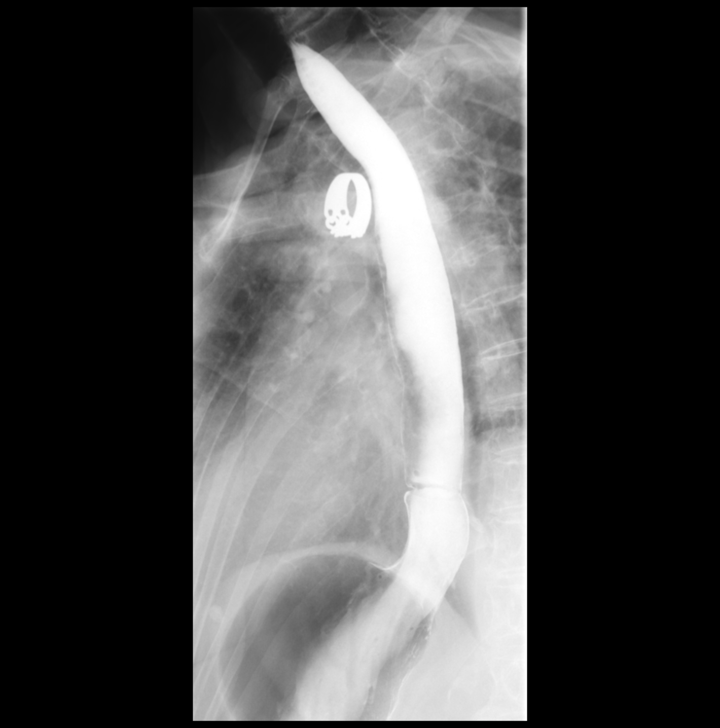

[Series 10: cp_standard · 0.54mm/px · 3 of 45 frames shown (2 of 3)]
[frame 7/45]
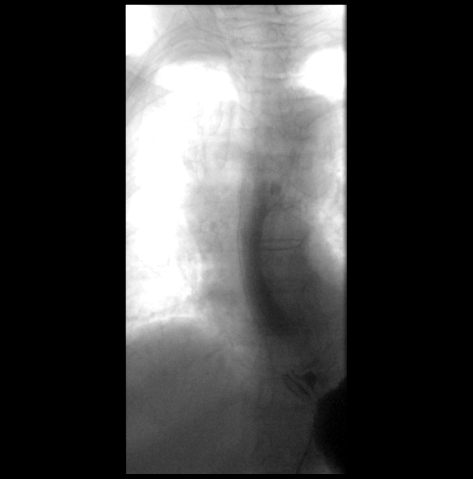
[frame 23/45]
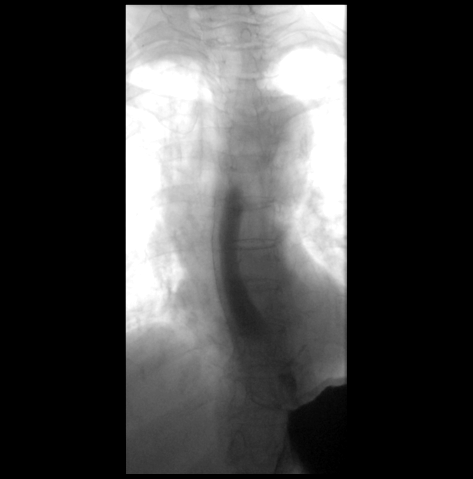
[frame 39/45]
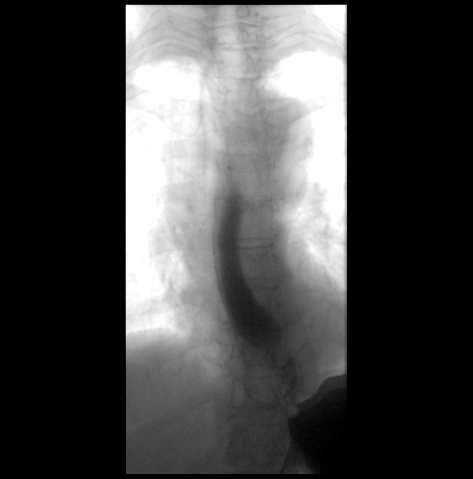

[Series 12: cp_standard · 0.27mm/px · 1 of 1 slices shown (3 of 3)]
[im 1/1]
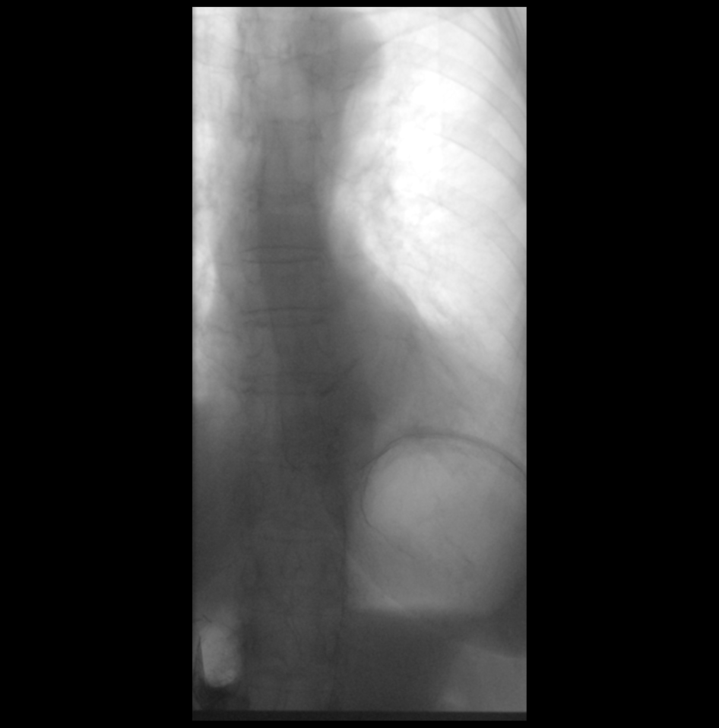

[13 of 18 positions shown; findings below may reference images not displayed]

FINDINGS: Double contrast images demonstrated a normal appearance of the
esophageal mucosa. Multiple single swallow attempts were observed,
which demonstrated failure to propagate any normal primary
peristaltic waves. Full column esophagram demonstrated no
esophageal mass, stricture or esophageal ring. Small hiatal hernia.
Water siphon test demonstrated the presence of gastroesophageal
reflux. A barium tablet was administered, which passed readily into
the stomach.
IMPRESSION: 1. Nonspecific esophageal motility disorder.
2. Small hiatal hernia.
3. Gastroesophageal reflux was observed.

## 2020-03-28 DIAGNOSIS — R059 Cough, unspecified: Secondary | ICD-10-CM | POA: Diagnosis not present

## 2020-03-28 DIAGNOSIS — Z6824 Body mass index (BMI) 24.0-24.9, adult: Secondary | ICD-10-CM | POA: Diagnosis not present

## 2020-03-30 DIAGNOSIS — J449 Chronic obstructive pulmonary disease, unspecified: Secondary | ICD-10-CM | POA: Diagnosis not present

## 2020-03-30 DIAGNOSIS — E782 Mixed hyperlipidemia: Secondary | ICD-10-CM | POA: Diagnosis not present

## 2020-03-30 DIAGNOSIS — Z139 Encounter for screening, unspecified: Secondary | ICD-10-CM | POA: Diagnosis not present

## 2020-03-30 DIAGNOSIS — J069 Acute upper respiratory infection, unspecified: Secondary | ICD-10-CM | POA: Diagnosis not present

## 2020-04-04 ENCOUNTER — Other Ambulatory Visit: Payer: Self-pay

## 2020-04-04 ENCOUNTER — Encounter: Payer: Self-pay | Admitting: Gastroenterology

## 2020-04-04 ENCOUNTER — Ambulatory Visit (AMBULATORY_SURGERY_CENTER): Payer: Medicare HMO | Admitting: Gastroenterology

## 2020-04-04 VITALS — BP 126/80 | HR 73 | Temp 97.1°F | Resp 22 | Ht 65.0 in | Wt 143.0 lb

## 2020-04-04 DIAGNOSIS — K222 Esophageal obstruction: Secondary | ICD-10-CM | POA: Diagnosis not present

## 2020-04-04 DIAGNOSIS — K449 Diaphragmatic hernia without obstruction or gangrene: Secondary | ICD-10-CM

## 2020-04-04 DIAGNOSIS — R1310 Dysphagia, unspecified: Secondary | ICD-10-CM | POA: Diagnosis not present

## 2020-04-04 DIAGNOSIS — K319 Disease of stomach and duodenum, unspecified: Secondary | ICD-10-CM

## 2020-04-04 DIAGNOSIS — K2950 Unspecified chronic gastritis without bleeding: Secondary | ICD-10-CM | POA: Diagnosis not present

## 2020-04-04 DIAGNOSIS — K2970 Gastritis, unspecified, without bleeding: Secondary | ICD-10-CM | POA: Diagnosis not present

## 2020-04-04 DIAGNOSIS — K219 Gastro-esophageal reflux disease without esophagitis: Secondary | ICD-10-CM

## 2020-04-04 MED ORDER — SODIUM CHLORIDE 0.9 % IV SOLN: 500.0000 mL | Freq: Once | INTRAVENOUS | Status: DC

## 2020-04-04 NOTE — Progress Notes (Signed)
PT taken to PACU. Monitors in place. VSS. Report given to RN. 

## 2020-04-04 NOTE — Op Note (Signed)
[facility] Patient Name: Tina [name] Procedure Date: 04/04/2020 8:00 AM MRN: [id number] Endoscopist: Jackquline Denmark , MD Age: 85 Referring MD:  Date of Birth: 1935/02/10 Gender: Female Account #: [id number] Procedure:                Upper GI endoscopy Indications:              Epigastric abdominal pain, Dysphagia Medicines:                Monitored Anesthesia Care Procedure:                Pre-Anesthesia Assessment:                           - Prior to the procedure, a History and Physical                            was performed, and patient medications and                            allergies were reviewed. The patient's tolerance of                            previous anesthesia was also reviewed. The risks                            and benefits of the procedure and the sedation                            options and risks were discussed with the patient.                            All questions were answered, and informed consent                            was obtained. Prior Anticoagulants: The patient has                            taken no previous anticoagulant or antiplatelet                            agents. ASA Grade Assessment: II - A patient with                            mild systemic disease. After reviewing the risks                            and benefits, the patient was deemed in                            satisfactory condition to undergo the procedure.                           After obtaining informed consent, the endoscope was  passed under direct vision. Throughout the                            procedure, the patient's blood pressure, pulse, and                            oxygen saturations were monitored continuously. The                            Endoscope was introduced through the mouth, and                            advanced to the second part of duodenum. The upper                            GI endoscopy was  accomplished without difficulty.                            The patient tolerated the procedure well. Scope In: Scope Out: Findings:                 A non-obstructing and moderate Schatzki ring was                            found at the gastroesophageal junction, 35cm from                            incisiors with diameter of 12 mm. Biopsies were                            obtained from the proximal and distal esophagus                            with cold forceps for histology of suspected                            eosinophilic esophagitis. The scope was withdrawn.                            Dilation was performed with a Maloney dilator with                            mild resistance at 50 Fr.                           A 2 cm hiatal hernia was present.                           Diffuse mild inflammation characterized by erythema                            was found in the gastric body and in the gastric  antrum. Biopsies were taken with a cold forceps for                            histology.                           The examined duodenum was normal. Biopsies for                            histology were taken with a cold forceps for                            evaluation of celiac disease. Complications:            No immediate complications. Estimated Blood Loss:     Estimated blood loss: none. Impression:               - Non-obstructing and moderate Schatzki ring.                            Biopsied. Dilated.                           - 2 cm hiatal hernia.                           - Gastritis. Biopsied. Recommendation:           - Patient has a contact number available for                            emergencies. The signs and symptoms of potential                            delayed complications were discussed with the                            patient. Return to normal activities tomorrow.                            Written discharge instructions were  provided to the                            patient.                           - Post-dil diet.                           - Continue present medications.                           - Await pathology results.                           - The findings and recommendations were discussed  with the patient.                           - Return to GI clinic PRN. Jackquline Denmark, MD 04/04/2020 8:23:05 AM This report has been signed electronically.

## 2020-04-04 NOTE — Patient Instructions (Signed)
Please read handouts provided. Continue present medications. Await pathology results. Post Dilation Diet. Return to GI clinic as needed.      YOU HAD AN ENDOSCOPIC PROCEDURE TODAY AT [facility]:   Refer to the procedure report that was given to you for any specific questions about what was found during the examination.  If the procedure report does not answer your questions, please call your gastroenterologist to clarify.  If you requested that your care partner not be given the details of your procedure findings, then the procedure report has been included in a sealed envelope for you to review at your convenience later.  YOU SHOULD EXPECT: Some feelings of bloating in the abdomen. Passage of more gas than usual.  Walking can help get rid of the air that was put into your GI tract during the procedure and reduce the bloating. If you had a lower endoscopy (such as a colonoscopy or flexible sigmoidoscopy) you may notice spotting of blood in your stool or on the toilet paper. If you underwent a bowel prep for your procedure, you may not have a normal bowel movement for a few days.  Please Note:  You might notice some irritation and congestion in your nose or some drainage.  This is from the oxygen used during your procedure.  There is no need for concern and it should clear up in a day or so.  SYMPTOMS TO REPORT IMMEDIATELY:    Following upper endoscopy (EGD)  Vomiting of blood or coffee ground material  New chest pain or pain under the shoulder blades  Painful or persistently difficult swallowing  New shortness of breath  Fever of 100F or higher  Black, tarry-looking stools  For urgent or emergent issues, a gastroenterologist can be reached at any hour by calling [phone]. Do not use MyChart messaging for urgent concerns.    DIET:   Drink plenty of fluids but you should avoid alcoholic beverages for 24 hours.  ACTIVITY:  You should plan to take it easy for  the rest of today and you should NOT DRIVE or use heavy machinery until tomorrow (because of the sedation medicines used during the test).    FOLLOW UP: Our staff will call the number listed on your records 48-72 hours following your procedure to check on you and address any questions or concerns that you may have regarding the information given to you following your procedure. If we do not reach you, we will leave a message.  We will attempt to reach you two times.  During this call, we will ask if you have developed any symptoms of COVID 19. If you develop any symptoms (ie: fever, flu-like symptoms, shortness of breath, cough etc.) before then, please call [phone].  If you test positive for Covid 19 in the 2 weeks post procedure, please call and report this information to [place].    If any biopsies were taken you will be contacted by phone or by letter within the next 1-3 weeks.  Please call us at [phone] if you have not heard about the biopsies in 3 weeks.    SIGNATURES/CONFIDENTIALITY: You and/or your care partner have signed paperwork which will be entered into your electronic medical record.  These signatures attest to the fact that that the information above on your After Visit Summary has been reviewed and is understood.  Full responsibility of the confidentiality of this discharge information lies with you and/or your care-partner.

## 2020-04-04 NOTE — Progress Notes (Signed)
Medical history reviewed with no changes noted. VS assessed by C.W 

## 2020-04-04 NOTE — Progress Notes (Signed)
Called to room to assist during endoscopic procedure.  Patient ID and intended procedure confirmed with present staff. Received instructions for my participation in the procedure from the performing physician.  

## 2020-04-06 ENCOUNTER — Telehealth: Payer: Self-pay

## 2020-04-06 NOTE — Telephone Encounter (Signed)
  Follow up Call-  Call back number 04/04/2020  Post procedure Call Back phone  # [phone]  Permission to leave phone message Yes  Some recent data might be hidden     Patient questions:  Do you have a fever, pain , or abdominal swelling? No. Pain Score  0 *  Have you tolerated food without any problems? Yes.    Have you been able to return to your normal activities? Yes.    Do you have any questions about your discharge instructions: Diet   No. Medications  No. Follow up visit  No.  Do you have questions or concerns about your Care? No.  Actions: * If pain score is 4 or above: No action needed, pain <4.  1. Have you developed a fever since your procedure? no  2.   Have you had an respiratory symptoms (SOB or cough) since your procedure? no  3.   Have you tested positive for COVID 19 since your procedure no  4.   Have you had any family members/close contacts diagnosed with the COVID 19 since your procedure?  no   If yes to any of these questions please route to Joylene John, RN and Joella Prince, RN

## 2020-04-19 DIAGNOSIS — R300 Dysuria: Secondary | ICD-10-CM | POA: Diagnosis not present

## 2020-04-19 DIAGNOSIS — N390 Urinary tract infection, site not specified: Secondary | ICD-10-CM | POA: Diagnosis not present

## 2020-04-21 DIAGNOSIS — E782 Mixed hyperlipidemia: Secondary | ICD-10-CM | POA: Diagnosis not present

## 2020-04-21 DIAGNOSIS — J449 Chronic obstructive pulmonary disease, unspecified: Secondary | ICD-10-CM | POA: Diagnosis not present

## 2020-04-21 DIAGNOSIS — K219 Gastro-esophageal reflux disease without esophagitis: Secondary | ICD-10-CM | POA: Diagnosis not present

## 2020-04-26 ENCOUNTER — Encounter: Payer: Self-pay | Admitting: Gastroenterology

## 2020-05-01 DIAGNOSIS — L820 Inflamed seborrheic keratosis: Secondary | ICD-10-CM | POA: Diagnosis not present

## 2020-05-01 DIAGNOSIS — L570 Actinic keratosis: Secondary | ICD-10-CM | POA: Diagnosis not present

## 2020-05-02 DIAGNOSIS — N3281 Overactive bladder: Secondary | ICD-10-CM | POA: Diagnosis not present

## 2020-05-02 DIAGNOSIS — N3090 Cystitis, unspecified without hematuria: Secondary | ICD-10-CM | POA: Diagnosis not present

## 2020-05-02 DIAGNOSIS — N952 Postmenopausal atrophic vaginitis: Secondary | ICD-10-CM | POA: Diagnosis not present

## 2020-05-11 DIAGNOSIS — S46919A Strain of unspecified muscle, fascia and tendon at shoulder and upper arm level, unspecified arm, initial encounter: Secondary | ICD-10-CM | POA: Diagnosis not present

## 2020-05-11 DIAGNOSIS — Z6825 Body mass index (BMI) 25.0-25.9, adult: Secondary | ICD-10-CM | POA: Diagnosis not present

## 2020-05-22 DIAGNOSIS — E782 Mixed hyperlipidemia: Secondary | ICD-10-CM | POA: Diagnosis not present

## 2020-05-22 DIAGNOSIS — K219 Gastro-esophageal reflux disease without esophagitis: Secondary | ICD-10-CM | POA: Diagnosis not present

## 2020-05-22 DIAGNOSIS — J449 Chronic obstructive pulmonary disease, unspecified: Secondary | ICD-10-CM | POA: Diagnosis not present

## 2020-06-14 DIAGNOSIS — J449 Chronic obstructive pulmonary disease, unspecified: Secondary | ICD-10-CM | POA: Diagnosis not present

## 2020-06-14 DIAGNOSIS — I517 Cardiomegaly: Secondary | ICD-10-CM | POA: Diagnosis not present

## 2020-06-14 DIAGNOSIS — Z20822 Contact with and (suspected) exposure to covid-19: Secondary | ICD-10-CM | POA: Diagnosis not present

## 2020-06-14 DIAGNOSIS — R0781 Pleurodynia: Secondary | ICD-10-CM | POA: Diagnosis not present

## 2020-06-14 DIAGNOSIS — R0902 Hypoxemia: Secondary | ICD-10-CM | POA: Diagnosis not present

## 2020-06-14 DIAGNOSIS — Z6824 Body mass index (BMI) 24.0-24.9, adult: Secondary | ICD-10-CM | POA: Diagnosis not present

## 2020-06-16 DIAGNOSIS — Z20822 Contact with and (suspected) exposure to covid-19: Secondary | ICD-10-CM | POA: Diagnosis not present

## 2020-06-19 DIAGNOSIS — K219 Gastro-esophageal reflux disease without esophagitis: Secondary | ICD-10-CM | POA: Diagnosis not present

## 2020-06-19 DIAGNOSIS — E782 Mixed hyperlipidemia: Secondary | ICD-10-CM | POA: Diagnosis not present

## 2020-06-19 DIAGNOSIS — J449 Chronic obstructive pulmonary disease, unspecified: Secondary | ICD-10-CM | POA: Diagnosis not present

## 2020-06-20 DIAGNOSIS — D692 Other nonthrombocytopenic purpura: Secondary | ICD-10-CM | POA: Diagnosis not present

## 2020-06-20 DIAGNOSIS — Z6824 Body mass index (BMI) 24.0-24.9, adult: Secondary | ICD-10-CM | POA: Diagnosis not present

## 2020-06-20 DIAGNOSIS — J449 Chronic obstructive pulmonary disease, unspecified: Secondary | ICD-10-CM | POA: Diagnosis not present

## 2020-06-20 DIAGNOSIS — R0602 Shortness of breath: Secondary | ICD-10-CM | POA: Diagnosis not present

## 2020-07-03 DIAGNOSIS — Z9842 Cataract extraction status, left eye: Secondary | ICD-10-CM | POA: Diagnosis not present

## 2020-07-03 DIAGNOSIS — Z961 Presence of intraocular lens: Secondary | ICD-10-CM | POA: Diagnosis not present

## 2020-07-03 DIAGNOSIS — H52223 Regular astigmatism, bilateral: Secondary | ICD-10-CM | POA: Diagnosis not present

## 2020-07-03 DIAGNOSIS — H35372 Puckering of macula, left eye: Secondary | ICD-10-CM | POA: Diagnosis not present

## 2020-07-03 DIAGNOSIS — Z9841 Cataract extraction status, right eye: Secondary | ICD-10-CM | POA: Diagnosis not present

## 2020-07-03 DIAGNOSIS — H5203 Hypermetropia, bilateral: Secondary | ICD-10-CM | POA: Diagnosis not present

## 2020-07-03 DIAGNOSIS — H524 Presbyopia: Secondary | ICD-10-CM | POA: Diagnosis not present

## 2020-07-05 DIAGNOSIS — H9201 Otalgia, right ear: Secondary | ICD-10-CM | POA: Diagnosis not present

## 2020-07-05 DIAGNOSIS — Z6824 Body mass index (BMI) 24.0-24.9, adult: Secondary | ICD-10-CM | POA: Diagnosis not present

## 2020-07-05 DIAGNOSIS — M26621 Arthralgia of right temporomandibular joint: Secondary | ICD-10-CM | POA: Diagnosis not present

## 2020-07-18 DIAGNOSIS — R059 Cough, unspecified: Secondary | ICD-10-CM | POA: Diagnosis not present

## 2020-07-18 DIAGNOSIS — J441 Chronic obstructive pulmonary disease with (acute) exacerbation: Secondary | ICD-10-CM | POA: Diagnosis not present

## 2020-07-20 DIAGNOSIS — J449 Chronic obstructive pulmonary disease, unspecified: Secondary | ICD-10-CM | POA: Diagnosis not present

## 2020-07-20 DIAGNOSIS — M810 Age-related osteoporosis without current pathological fracture: Secondary | ICD-10-CM | POA: Diagnosis not present

## 2020-07-20 DIAGNOSIS — M1990 Unspecified osteoarthritis, unspecified site: Secondary | ICD-10-CM | POA: Diagnosis not present

## 2020-07-24 DIAGNOSIS — Z6824 Body mass index (BMI) 24.0-24.9, adult: Secondary | ICD-10-CM | POA: Diagnosis not present

## 2020-07-24 DIAGNOSIS — B370 Candidal stomatitis: Secondary | ICD-10-CM | POA: Diagnosis not present

## 2020-07-24 DIAGNOSIS — J449 Chronic obstructive pulmonary disease, unspecified: Secondary | ICD-10-CM | POA: Diagnosis not present

## 2020-07-24 DIAGNOSIS — Z20822 Contact with and (suspected) exposure to covid-19: Secondary | ICD-10-CM | POA: Diagnosis not present

## 2020-07-24 DIAGNOSIS — Z7689 Persons encountering health services in other specified circumstances: Secondary | ICD-10-CM | POA: Diagnosis not present

## 2020-08-01 DIAGNOSIS — B370 Candidal stomatitis: Secondary | ICD-10-CM | POA: Diagnosis not present

## 2020-08-01 DIAGNOSIS — Z6824 Body mass index (BMI) 24.0-24.9, adult: Secondary | ICD-10-CM | POA: Diagnosis not present

## 2020-08-01 DIAGNOSIS — J449 Chronic obstructive pulmonary disease, unspecified: Secondary | ICD-10-CM | POA: Diagnosis not present

## 2020-08-19 DIAGNOSIS — J449 Chronic obstructive pulmonary disease, unspecified: Secondary | ICD-10-CM | POA: Diagnosis not present

## 2020-08-19 DIAGNOSIS — M810 Age-related osteoporosis without current pathological fracture: Secondary | ICD-10-CM | POA: Diagnosis not present

## 2020-08-19 DIAGNOSIS — M1990 Unspecified osteoarthritis, unspecified site: Secondary | ICD-10-CM | POA: Diagnosis not present

## 2020-08-21 DIAGNOSIS — E782 Mixed hyperlipidemia: Secondary | ICD-10-CM | POA: Diagnosis not present

## 2020-08-31 DIAGNOSIS — M546 Pain in thoracic spine: Secondary | ICD-10-CM | POA: Diagnosis not present

## 2020-08-31 DIAGNOSIS — J449 Chronic obstructive pulmonary disease, unspecified: Secondary | ICD-10-CM | POA: Diagnosis not present

## 2020-08-31 DIAGNOSIS — Z1331 Encounter for screening for depression: Secondary | ICD-10-CM | POA: Diagnosis not present

## 2020-08-31 DIAGNOSIS — F339 Major depressive disorder, recurrent, unspecified: Secondary | ICD-10-CM | POA: Diagnosis not present

## 2020-08-31 DIAGNOSIS — Z0000 Encounter for general adult medical examination without abnormal findings: Secondary | ICD-10-CM | POA: Diagnosis not present

## 2020-08-31 DIAGNOSIS — E782 Mixed hyperlipidemia: Secondary | ICD-10-CM | POA: Diagnosis not present

## 2020-08-31 DIAGNOSIS — Z139 Encounter for screening, unspecified: Secondary | ICD-10-CM | POA: Diagnosis not present

## 2020-08-31 DIAGNOSIS — Z136 Encounter for screening for cardiovascular disorders: Secondary | ICD-10-CM | POA: Diagnosis not present

## 2020-09-10 DIAGNOSIS — L03116 Cellulitis of left lower limb: Secondary | ICD-10-CM | POA: Diagnosis not present

## 2020-09-14 DIAGNOSIS — Z6824 Body mass index (BMI) 24.0-24.9, adult: Secondary | ICD-10-CM | POA: Diagnosis not present

## 2020-09-14 DIAGNOSIS — L03119 Cellulitis of unspecified part of limb: Secondary | ICD-10-CM | POA: Diagnosis not present

## 2020-09-19 DIAGNOSIS — E782 Mixed hyperlipidemia: Secondary | ICD-10-CM | POA: Diagnosis not present

## 2020-09-19 DIAGNOSIS — J449 Chronic obstructive pulmonary disease, unspecified: Secondary | ICD-10-CM | POA: Diagnosis not present

## 2020-09-19 DIAGNOSIS — M1990 Unspecified osteoarthritis, unspecified site: Secondary | ICD-10-CM | POA: Diagnosis not present

## 2020-09-21 DIAGNOSIS — M546 Pain in thoracic spine: Secondary | ICD-10-CM | POA: Diagnosis not present

## 2020-09-21 DIAGNOSIS — M549 Dorsalgia, unspecified: Secondary | ICD-10-CM | POA: Diagnosis not present

## 2020-09-26 DIAGNOSIS — M549 Dorsalgia, unspecified: Secondary | ICD-10-CM | POA: Diagnosis not present

## 2020-09-26 DIAGNOSIS — M546 Pain in thoracic spine: Secondary | ICD-10-CM | POA: Diagnosis not present

## 2020-09-28 DIAGNOSIS — M546 Pain in thoracic spine: Secondary | ICD-10-CM | POA: Diagnosis not present

## 2020-09-28 DIAGNOSIS — M549 Dorsalgia, unspecified: Secondary | ICD-10-CM | POA: Diagnosis not present

## 2020-10-02 ENCOUNTER — Other Ambulatory Visit: Payer: Self-pay | Admitting: Family Medicine

## 2020-10-02 DIAGNOSIS — M549 Dorsalgia, unspecified: Secondary | ICD-10-CM | POA: Diagnosis not present

## 2020-10-02 DIAGNOSIS — M546 Pain in thoracic spine: Secondary | ICD-10-CM | POA: Diagnosis not present

## 2020-10-02 DIAGNOSIS — Z139 Encounter for screening, unspecified: Secondary | ICD-10-CM

## 2020-10-10 ENCOUNTER — Ambulatory Visit
Admission: RE | Admit: 2020-10-10 | Discharge: 2020-10-10 | Disposition: A | Discharge status: Home / Self Care | Payer: Medicare HMO | Source: Ambulatory Visit | Attending: Family Medicine | Admitting: Family Medicine

## 2020-10-10 ENCOUNTER — Other Ambulatory Visit: Payer: Self-pay

## 2020-10-10 DIAGNOSIS — Z139 Encounter for screening, unspecified: Secondary | ICD-10-CM

## 2020-10-10 DIAGNOSIS — Z1231 Encounter for screening mammogram for malignant neoplasm of breast: Secondary | ICD-10-CM | POA: Diagnosis not present

## 2020-10-10 IMAGING — MG MM DIGITAL SCREENING BILAT W/ TOMO AND CAD
8 series · 8 of 24 positions shown · non-contrast
Comparison: Previous exam(s).

CLINICAL DATA: Screening.

EXAM:
DIGITAL SCREENING BILATERAL MAMMOGRAM WITH TOMOSYNTHESIS AND CAD
TECHNIQUE: Bilateral screening digital craniocaudal and mediolateral oblique
mammograms were obtained. Bilateral screening digital breast
tomosynthesis was performed. The images were evaluated with
computer-aided detection.

[R CC synth-2D]
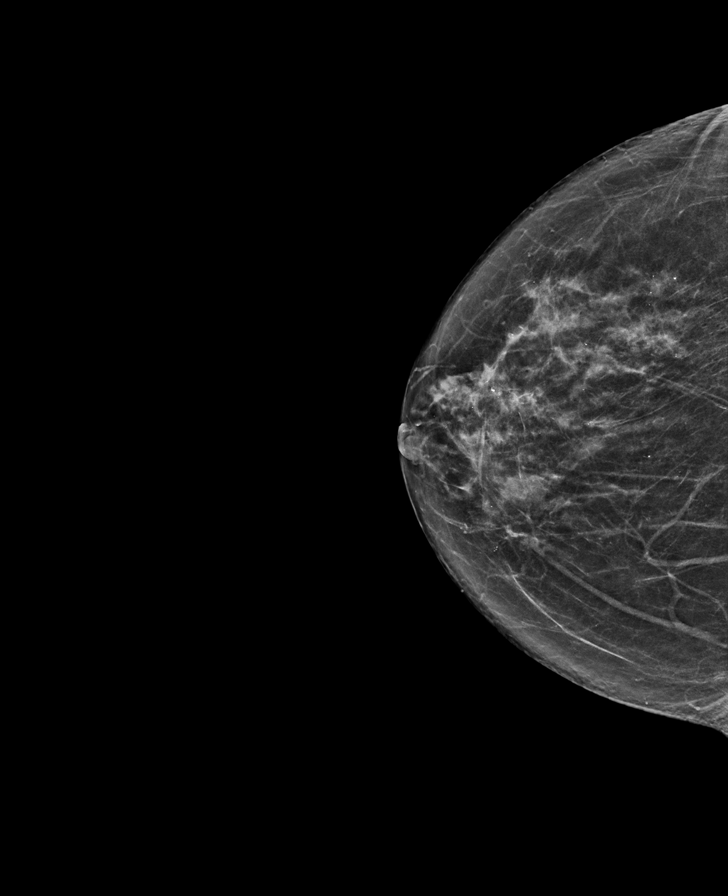

[L CC synth-2D]
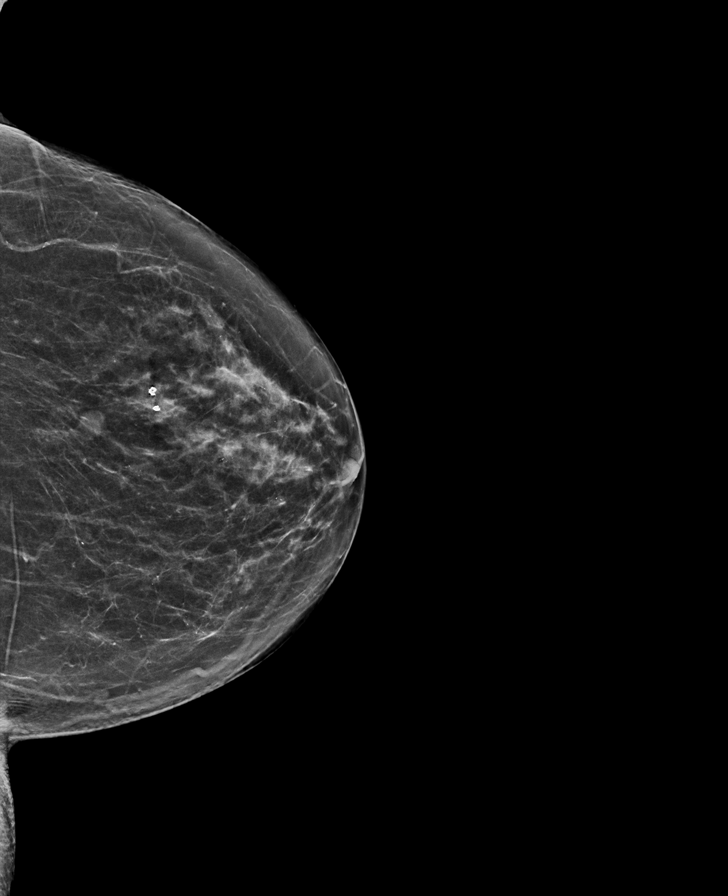

[R MLO synth-2D]
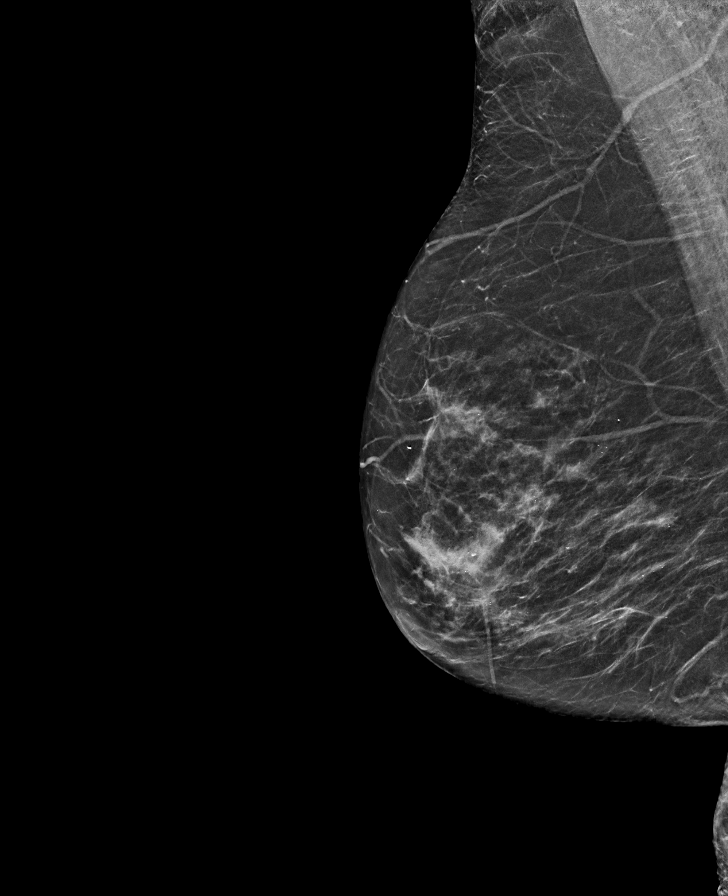

[L MLO synth-2D]
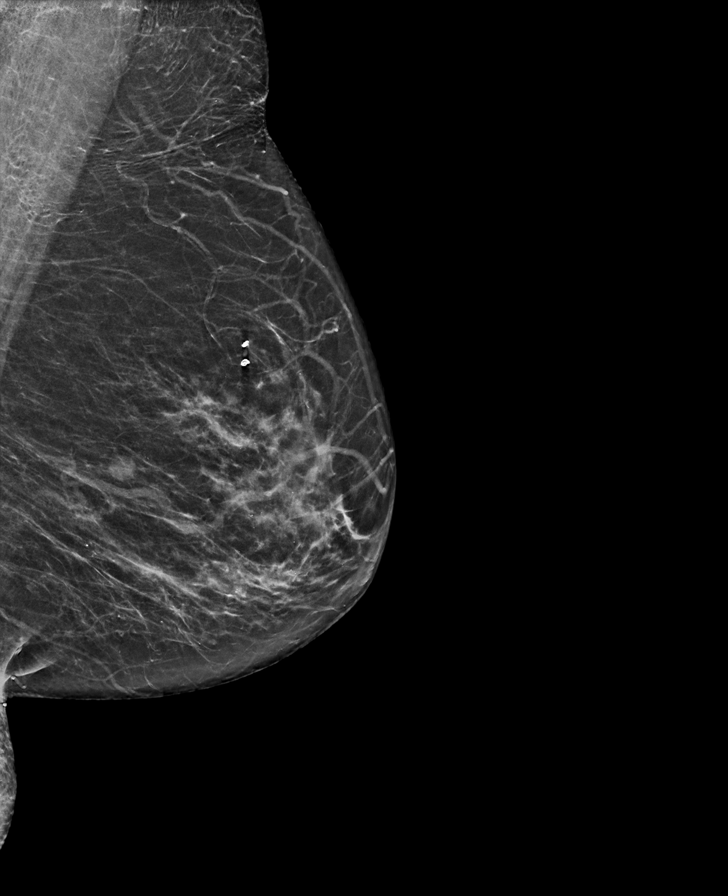

[L CC tomo · tomo slice 33/64.0]
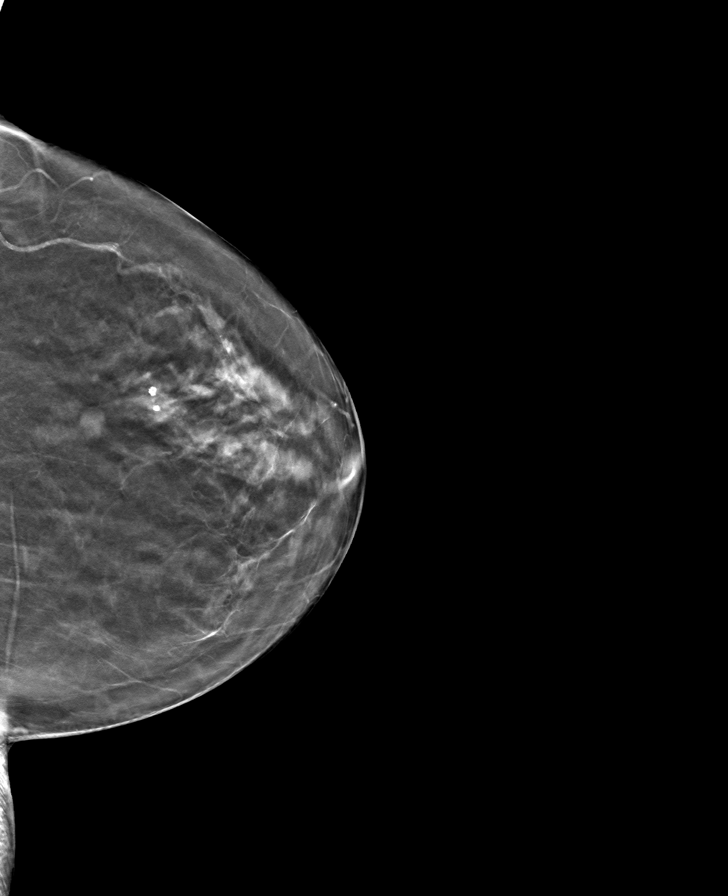

[R MLO tomo · tomo slice 31/62.0]
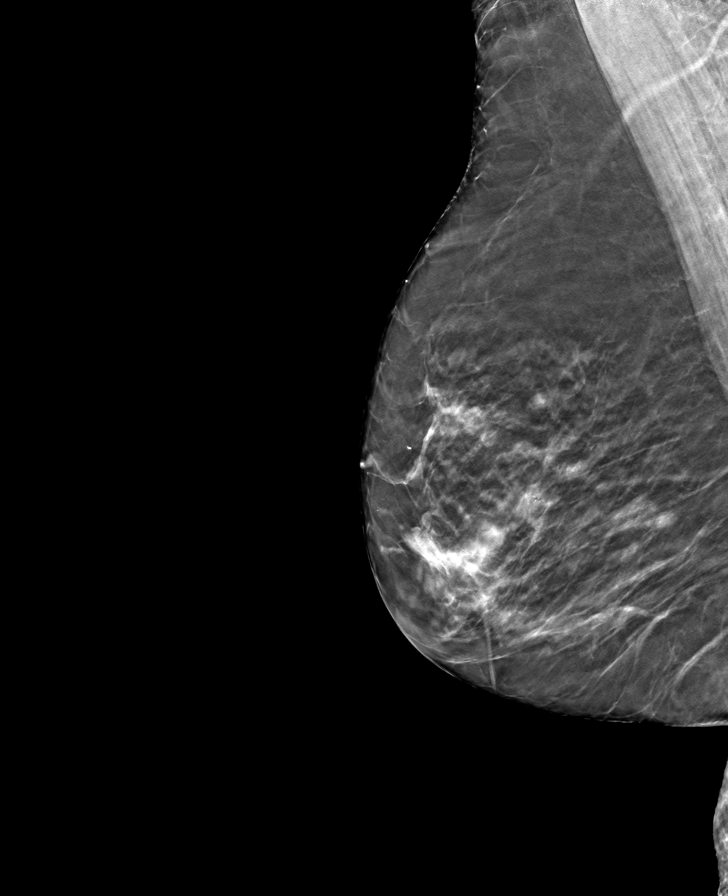

[R CC tomo · tomo slice 33/64.0]
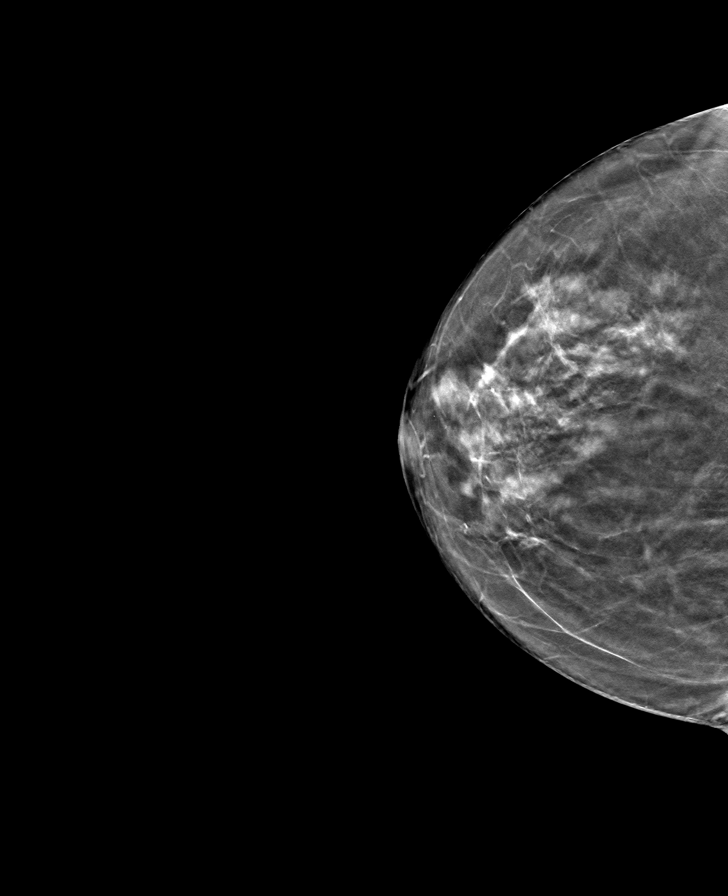

[L MLO tomo · tomo slice 32/63.0]
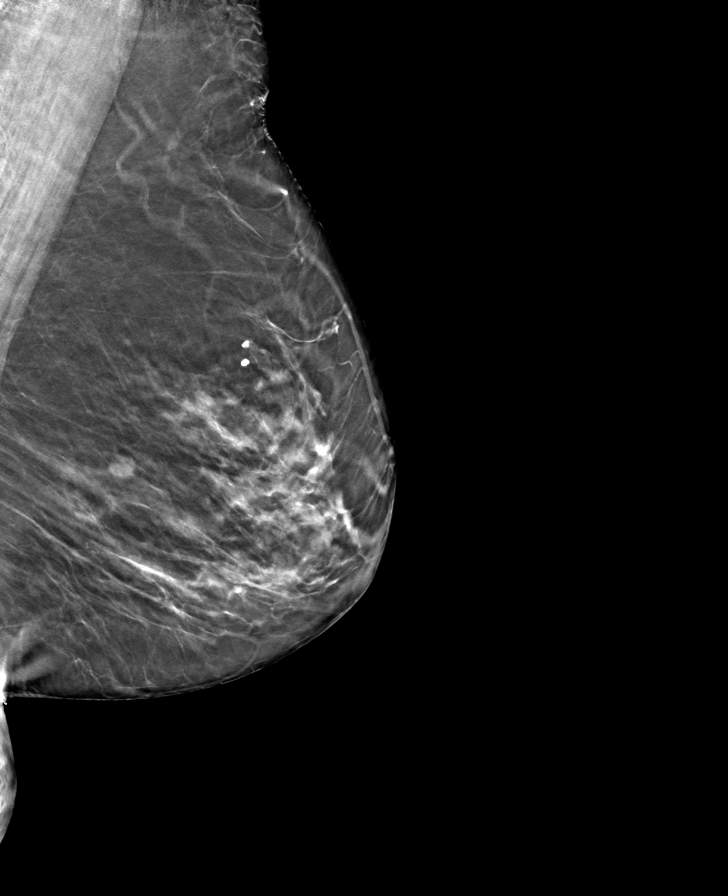

[8 of 24 positions shown; findings below may reference images not displayed]

ACR Breast Density Category b: There are scattered areas of
fibroglandular density.
FINDINGS: There are no findings suspicious for malignancy.
IMPRESSION: No mammographic evidence of malignancy. A result letter of this
screening mammogram will be mailed directly to the patient.

RECOMMENDATION:
Screening mammogram in one year. (Code:[BY])

BI-RADS CATEGORY  1: Negative.

## 2020-10-19 DIAGNOSIS — J449 Chronic obstructive pulmonary disease, unspecified: Secondary | ICD-10-CM | POA: Diagnosis not present

## 2020-10-19 DIAGNOSIS — M1990 Unspecified osteoarthritis, unspecified site: Secondary | ICD-10-CM | POA: Diagnosis not present

## 2020-10-19 DIAGNOSIS — E782 Mixed hyperlipidemia: Secondary | ICD-10-CM | POA: Diagnosis not present

## 2020-10-23 DIAGNOSIS — K068 Other specified disorders of gingiva and edentulous alveolar ridge: Secondary | ICD-10-CM | POA: Diagnosis not present

## 2020-10-24 DIAGNOSIS — M546 Pain in thoracic spine: Secondary | ICD-10-CM | POA: Diagnosis not present

## 2020-10-24 DIAGNOSIS — M549 Dorsalgia, unspecified: Secondary | ICD-10-CM | POA: Diagnosis not present

## 2020-10-25 DIAGNOSIS — M549 Dorsalgia, unspecified: Secondary | ICD-10-CM | POA: Diagnosis not present

## 2020-10-25 DIAGNOSIS — M546 Pain in thoracic spine: Secondary | ICD-10-CM | POA: Diagnosis not present

## 2020-10-29 DIAGNOSIS — L570 Actinic keratosis: Secondary | ICD-10-CM | POA: Diagnosis not present

## 2020-10-29 DIAGNOSIS — L821 Other seborrheic keratosis: Secondary | ICD-10-CM | POA: Diagnosis not present

## 2020-10-29 DIAGNOSIS — L814 Other melanin hyperpigmentation: Secondary | ICD-10-CM | POA: Diagnosis not present

## 2020-10-29 DIAGNOSIS — L578 Other skin changes due to chronic exposure to nonionizing radiation: Secondary | ICD-10-CM | POA: Diagnosis not present

## 2020-10-29 DIAGNOSIS — D1801 Hemangioma of skin and subcutaneous tissue: Secondary | ICD-10-CM | POA: Diagnosis not present

## 2020-10-29 DIAGNOSIS — D225 Melanocytic nevi of trunk: Secondary | ICD-10-CM | POA: Diagnosis not present

## 2020-11-19 DIAGNOSIS — M1990 Unspecified osteoarthritis, unspecified site: Secondary | ICD-10-CM | POA: Diagnosis not present

## 2020-11-19 DIAGNOSIS — J449 Chronic obstructive pulmonary disease, unspecified: Secondary | ICD-10-CM | POA: Diagnosis not present

## 2020-11-19 DIAGNOSIS — E782 Mixed hyperlipidemia: Secondary | ICD-10-CM | POA: Diagnosis not present

## 2020-11-20 DIAGNOSIS — N3090 Cystitis, unspecified without hematuria: Secondary | ICD-10-CM | POA: Diagnosis not present

## 2020-11-20 DIAGNOSIS — N952 Postmenopausal atrophic vaginitis: Secondary | ICD-10-CM | POA: Diagnosis not present

## 2020-11-20 DIAGNOSIS — N3281 Overactive bladder: Secondary | ICD-10-CM | POA: Diagnosis not present

## 2020-12-09 DIAGNOSIS — H00012 Hordeolum externum right lower eyelid: Secondary | ICD-10-CM | POA: Diagnosis not present

## 2020-12-20 DIAGNOSIS — M1990 Unspecified osteoarthritis, unspecified site: Secondary | ICD-10-CM | POA: Diagnosis not present

## 2020-12-20 DIAGNOSIS — J449 Chronic obstructive pulmonary disease, unspecified: Secondary | ICD-10-CM | POA: Diagnosis not present

## 2020-12-20 DIAGNOSIS — E782 Mixed hyperlipidemia: Secondary | ICD-10-CM | POA: Diagnosis not present

## 2021-01-07 DIAGNOSIS — H353131 Nonexudative age-related macular degeneration, bilateral, early dry stage: Secondary | ICD-10-CM | POA: Diagnosis not present

## 2021-01-07 DIAGNOSIS — Z9849 Cataract extraction status, unspecified eye: Secondary | ICD-10-CM | POA: Diagnosis not present

## 2021-01-07 DIAGNOSIS — Z961 Presence of intraocular lens: Secondary | ICD-10-CM | POA: Diagnosis not present

## 2021-01-07 DIAGNOSIS — H524 Presbyopia: Secondary | ICD-10-CM | POA: Diagnosis not present

## 2021-01-07 DIAGNOSIS — H5203 Hypermetropia, bilateral: Secondary | ICD-10-CM | POA: Diagnosis not present

## 2021-01-07 DIAGNOSIS — H35372 Puckering of macula, left eye: Secondary | ICD-10-CM | POA: Diagnosis not present

## 2021-01-07 DIAGNOSIS — H52223 Regular astigmatism, bilateral: Secondary | ICD-10-CM | POA: Diagnosis not present

## 2021-01-15 DIAGNOSIS — H47011 Ischemic optic neuropathy, right eye: Secondary | ICD-10-CM | POA: Diagnosis not present

## 2021-01-15 DIAGNOSIS — H353132 Nonexudative age-related macular degeneration, bilateral, intermediate dry stage: Secondary | ICD-10-CM | POA: Diagnosis not present

## 2021-01-16 DIAGNOSIS — H47011 Ischemic optic neuropathy, right eye: Secondary | ICD-10-CM | POA: Diagnosis not present

## 2021-01-16 DIAGNOSIS — E782 Mixed hyperlipidemia: Secondary | ICD-10-CM | POA: Diagnosis not present

## 2021-01-17 ENCOUNTER — Emergency Department (HOSPITAL_COMMUNITY): Payer: Medicare HMO

## 2021-01-17 ENCOUNTER — Other Ambulatory Visit: Payer: Self-pay

## 2021-01-17 ENCOUNTER — Encounter (HOSPITAL_COMMUNITY): Payer: Self-pay | Admitting: Emergency Medicine

## 2021-01-17 ENCOUNTER — Emergency Department (HOSPITAL_COMMUNITY)
Admission: EM | Admit: 2021-01-17 | Discharge: 2021-01-18 | Disposition: A | Discharge status: Home / Self Care | Payer: Medicare HMO | Attending: Emergency Medicine | Admitting: Emergency Medicine

## 2021-01-17 DIAGNOSIS — Z7952 Long term (current) use of systemic steroids: Secondary | ICD-10-CM | POA: Insufficient documentation

## 2021-01-17 DIAGNOSIS — J449 Chronic obstructive pulmonary disease, unspecified: Secondary | ICD-10-CM | POA: Insufficient documentation

## 2021-01-17 DIAGNOSIS — H5340 Unspecified visual field defects: Secondary | ICD-10-CM | POA: Diagnosis not present

## 2021-01-17 DIAGNOSIS — R9431 Abnormal electrocardiogram [ECG] [EKG]: Secondary | ICD-10-CM | POA: Diagnosis not present

## 2021-01-17 DIAGNOSIS — I639 Cerebral infarction, unspecified: Secondary | ICD-10-CM | POA: Insufficient documentation

## 2021-01-17 DIAGNOSIS — H547 Unspecified visual loss: Secondary | ICD-10-CM | POA: Diagnosis not present

## 2021-01-17 DIAGNOSIS — H539 Unspecified visual disturbance: Secondary | ICD-10-CM | POA: Diagnosis not present

## 2021-01-17 DIAGNOSIS — H47011 Ischemic optic neuropathy, right eye: Secondary | ICD-10-CM | POA: Diagnosis not present

## 2021-01-17 DIAGNOSIS — R930 Abnormal findings on diagnostic imaging of skull and head, not elsewhere classified: Secondary | ICD-10-CM | POA: Diagnosis not present

## 2021-01-17 LAB — CBC
HCT: 39 % (ref 36.0–46.0)
Hemoglobin: 13 g/dL (ref 12.0–15.0)
MCH: 31.8 pg (ref 26.0–34.0)
MCHC: 33.3 g/dL (ref 30.0–36.0)
MCV: 95.4 fL (ref 80.0–100.0)
Platelets: 287 10*3/uL (ref 150–400)
RBC: 4.09 MIL/uL (ref 3.87–5.11)
RDW: 13.2 % (ref 11.5–15.5)
WBC: 9.5 10*3/uL (ref 4.0–10.5)
nRBC: 0 % (ref 0.0–0.2)

## 2021-01-17 LAB — DIFFERENTIAL
Abs Immature Granulocytes: 0.05 10*3/uL (ref 0.00–0.07)
Basophils Absolute: 0 10*3/uL (ref 0.0–0.1)
Basophils Relative: 0 %
Eosinophils Absolute: 0 10*3/uL (ref 0.0–0.5)
Eosinophils Relative: 0 %
Immature Granulocytes: 1 %
Lymphocytes Relative: 14 %
Lymphs Abs: 1.4 10*3/uL (ref 0.7–4.0)
Monocytes Absolute: 0.3 10*3/uL (ref 0.1–1.0)
Monocytes Relative: 3 %
Neutro Abs: 7.8 10*3/uL — ABNORMAL HIGH (ref 1.7–7.7)
Neutrophils Relative %: 82 %

## 2021-01-17 LAB — I-STAT CHEM 8, ED
BUN: 14 mg/dL (ref 8–23)
Calcium, Ion: 1.23 mmol/L (ref 1.15–1.40)
Chloride: 102 mmol/L (ref 98–111)
Creatinine, Ser: 0.6 mg/dL (ref 0.44–1.00)
Glucose, Bld: 129 mg/dL — ABNORMAL HIGH (ref 70–99)
HCT: 40 % (ref 36.0–46.0)
Hemoglobin: 13.6 g/dL (ref 12.0–15.0)
Potassium: 4.5 mmol/L (ref 3.5–5.1)
Sodium: 136 mmol/L (ref 135–145)
TCO2: 25 mmol/L (ref 22–32)

## 2021-01-17 LAB — PROTIME-INR
INR: 1 (ref 0.8–1.2)
Prothrombin Time: 12.6 seconds (ref 11.4–15.2)

## 2021-01-17 LAB — COMPREHENSIVE METABOLIC PANEL
ALT: 19 U/L (ref 0–44)
AST: 30 U/L (ref 15–41)
Albumin: 4.2 g/dL (ref 3.5–5.0)
Alkaline Phosphatase: 54 U/L (ref 38–126)
Anion gap: 10 (ref 5–15)
BUN: 12 mg/dL (ref 8–23)
CO2: 23 mmol/L (ref 22–32)
Calcium: 9.6 mg/dL (ref 8.9–10.3)
Chloride: 101 mmol/L (ref 98–111)
Creatinine, Ser: 0.79 mg/dL (ref 0.44–1.00)
GFR, Estimated: >60 mL/min (ref >60)
Glucose, Bld: 133 mg/dL — ABNORMAL HIGH (ref 70–99)
Potassium: 4.2 mmol/L (ref 3.5–5.1)
Sodium: 134 mmol/L — ABNORMAL LOW (ref 135–145)
Total Bilirubin: 0.5 mg/dL (ref 0.3–1.2)
Total Protein: 7 g/dL (ref 6.5–8.1)

## 2021-01-17 LAB — APTT: aPTT: 32 seconds (ref 24–36)

## 2021-01-17 IMAGING — MR MR HEAD WO/W CM
7 of 13 series · 23 of 48 positions shown · IV contrast (gadavist)
Comparison: None.

CLINICAL DATA: Vision loss with monocular ischemic changes.

EXAM:
MRI HEAD AND ORBITS WITHOUT AND WITH CONTRAST
TECHNIQUE: Multiplanar, multiecho pulse sequences of the brain and surrounding
structures were obtained without and with intravenous contrast.
Multiplanar, multiecho pulse sequences of the orbits and surrounding
structures were obtained including fat saturation techniques, before
and after intravenous contrast administration.
CONTRAST:  6.5mL GADAVIST GADOBUTROL 1 MMOL/ML IV SOLN

[Series 2: DWI · axial · 3.0mm · 0.94mm/px · z∈[-92,+63]mm · 7 of 108 slices shown (1 of 2)]
[im 1/108]
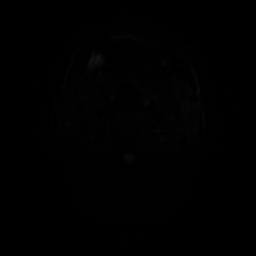
[im 18/108]
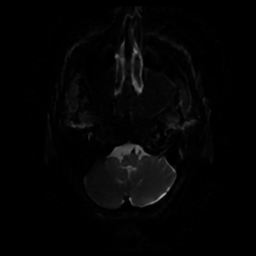
[im 36/108]
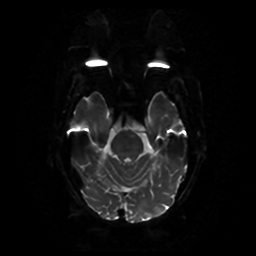
[im 54/108]
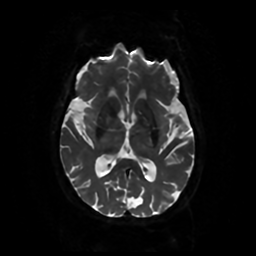
[im 72/108]
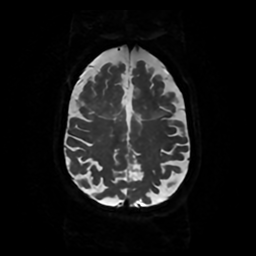
[im 90/108]
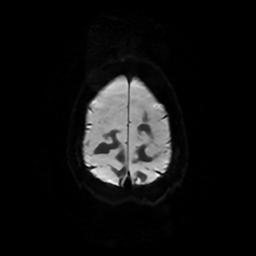
[im 108/108]
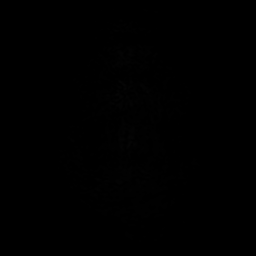

[Series 3: DWI · coronal · 4.0mm · 0.94mm/px · 5 of 74 slices shown (2 of 2)]
[im 1/74]
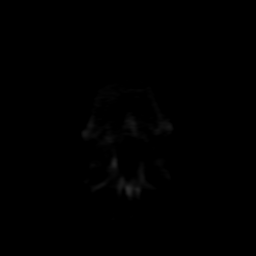
[im 19/74]
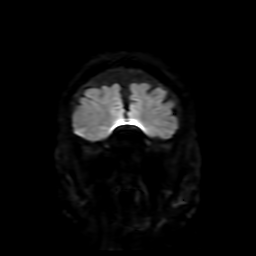
[im 37/74]
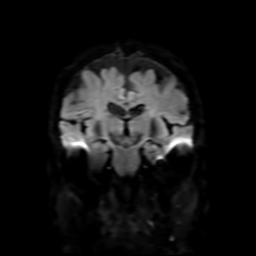
[im 55/74]
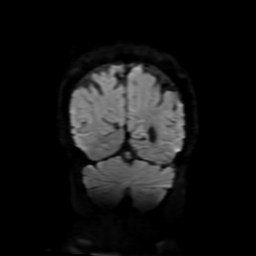
[im 74/74]
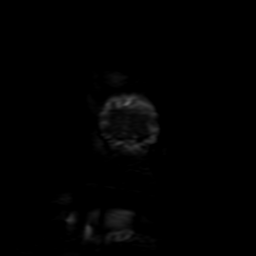

[Series 4: FLAIR · sagittal · 5.0mm · 0.23mm/px · 2 of 25 slices shown (1 of 2)]
[im 1/25]
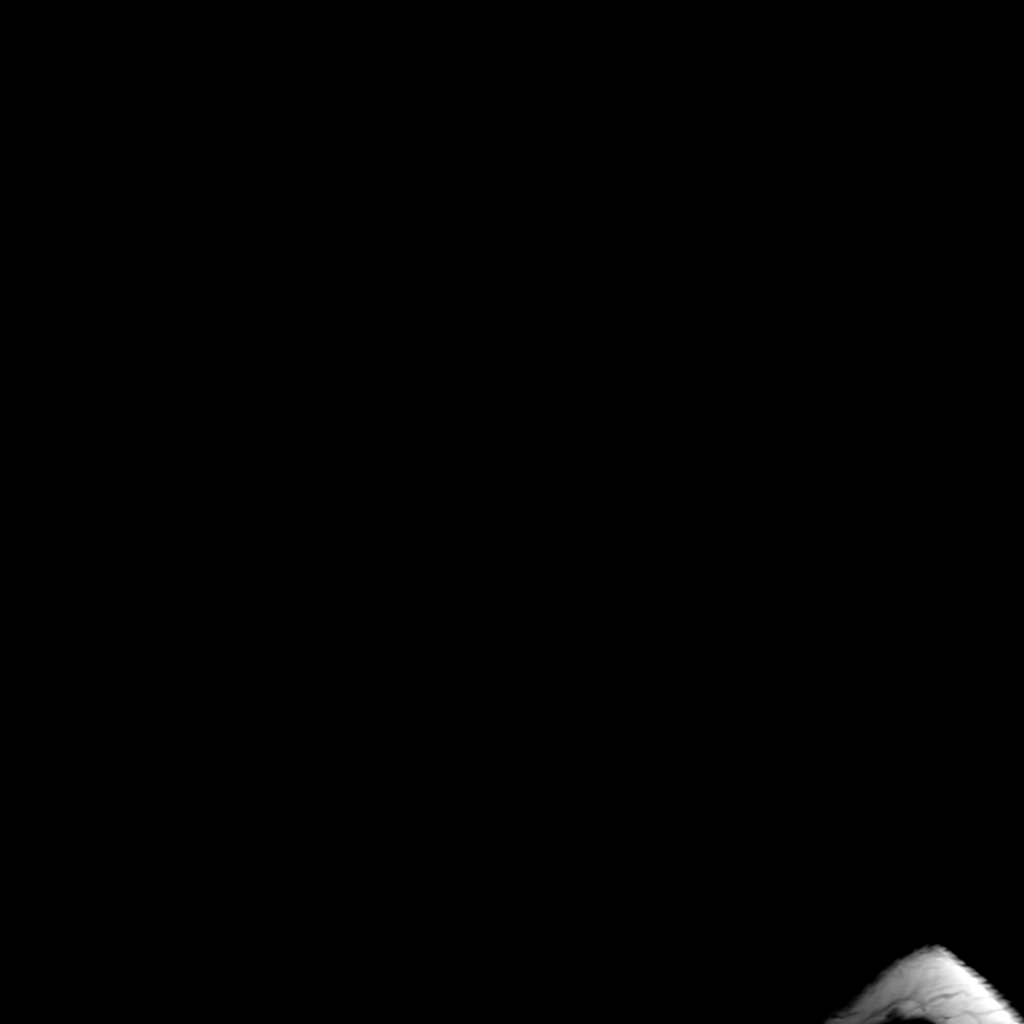
[im 25/25]
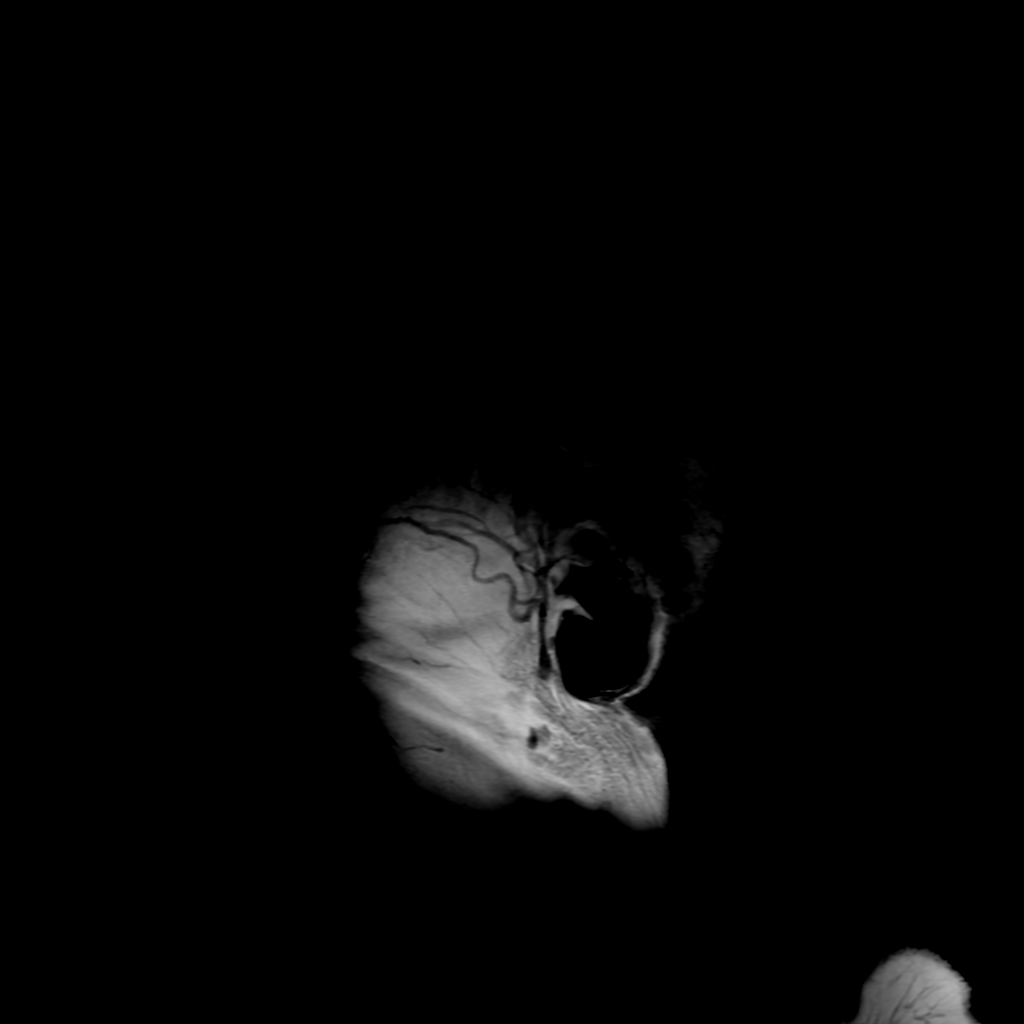

[Series 5: T2 · axial · 5.0mm · 0.23mm/px · 1 of 27 slices shown]
[im 1/27]
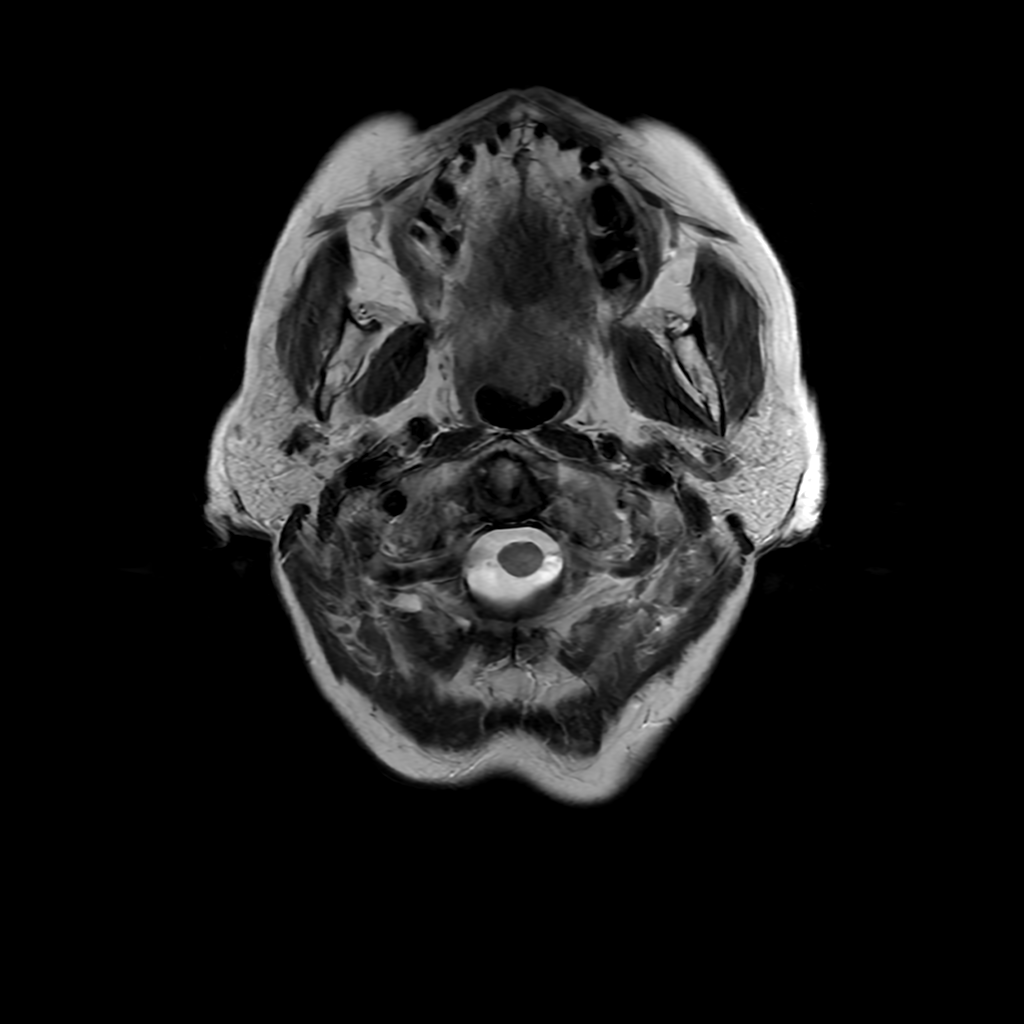

[Series 6: FLAIR · axial · 4.0mm · 0.45mm/px · z∈[-78,+79]mm · 2 of 37 slices shown (2 of 2)]
[im 1/37]
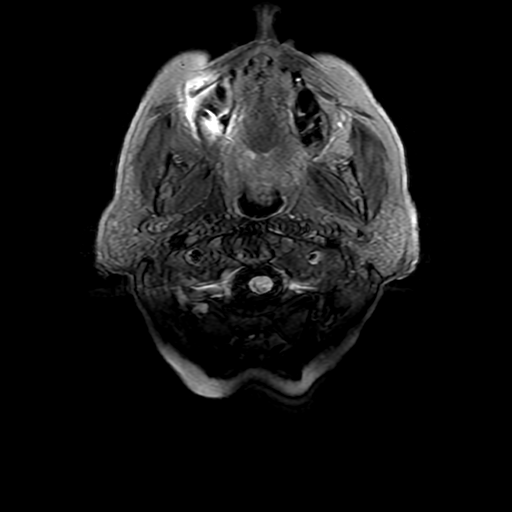
[im 37/37]
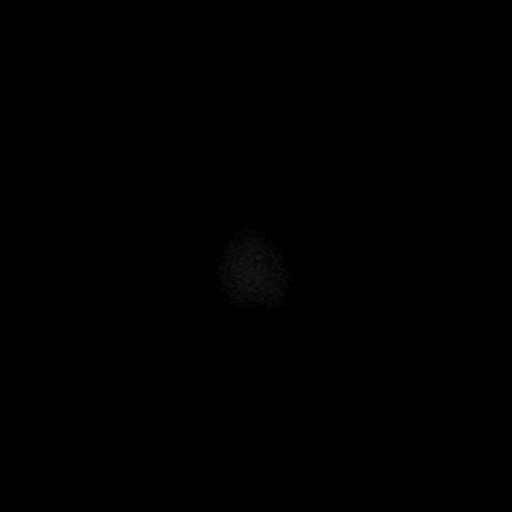

[Series 250: ADC · axial · 3.0mm · 0.94mm/px · z∈[-92,+63]mm · 4 of 54 slices shown (1 of 2)]
[im 1/54]
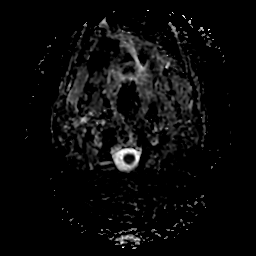
[im 18/54]
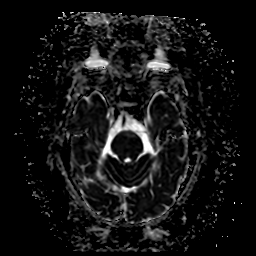
[im 36/54]
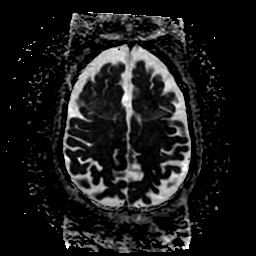
[im 54/54]
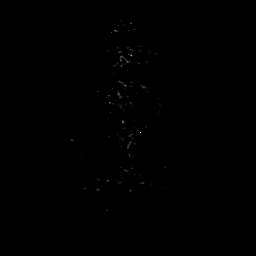

[Series 350: ADC · coronal · 4.0mm · 0.94mm/px · 2 of 36 slices shown (2 of 2)]
[im 1/36]
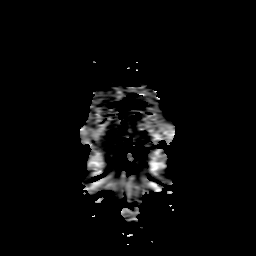
[im 36/36]
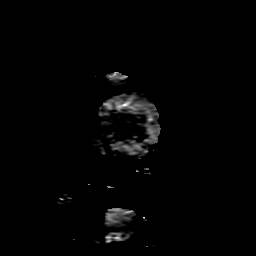

[23 of 48 positions shown; findings below may reference images not displayed]

FINDINGS: MRI HEAD FINDINGS

Brain: No acute infarct, mass effect or extra-axial collection. No
acute or chronic hemorrhage. Hyperintense T2-weighted signal is
moderately widespread throughout the white matter. Generalized
volume loss without a clear lobar predilection. The midline
structures are normal. There is no abnormal contrast enhancement.

Vascular: Major flow voids are preserved.

Skull and upper cervical spine: Normal calvarium and skull base.
Visualized upper cervical spine and soft tissues are normal.

Sinuses/Orbits:No paranasal sinus fluid levels or advanced mucosal
thickening. No mastoid or middle ear effusion. Normal orbits.

MRI ORBITS FINDINGS

Orbits:

--Globes: Normal.

--Bony orbit: Normal.

--Preseptal soft tissues: Normal.

--Intra- and extraconal orbital fat: Normal. No inflammatory
stranding.

--Optic nerves: Normal.

--Lacrimal glands and fossae: Normal.

--Extraocular muscles: Normal.

Visualized sinuses:  No fluid levels or advanced mucosal thickening.

Soft tissues: Normal.

Limited intracranial: Normal.
IMPRESSION: 1. No acute intracranial abnormality.
2. Normal MRI of the orbits.
3. Moderate chronic small vessel ischemic changes of the cerebral
white matter.

## 2021-01-17 IMAGING — MR MR ORBITS WO/W CM
4 of 6 series · 19 of 48 positions shown · IV contrast (6.5 M gad)
Comparison: None.

CLINICAL DATA: Vision loss with monocular ischemic changes.

EXAM:
MRI HEAD AND ORBITS WITHOUT AND WITH CONTRAST
TECHNIQUE: Multiplanar, multiecho pulse sequences of the brain and surrounding
structures were obtained without and with intravenous contrast.
Multiplanar, multiecho pulse sequences of the orbits and surrounding
structures were obtained including fat saturation techniques, before
and after intravenous contrast administration.
CONTRAST:  6.5mL GADAVIST GADOBUTROL 1 MMOL/ML IV SOLN

[Series 9: T2 fat-sat · coronal · 4.0mm · 0.35mm/px · 8 of 22 slices shown (1 of 2)]
[im 1/22]
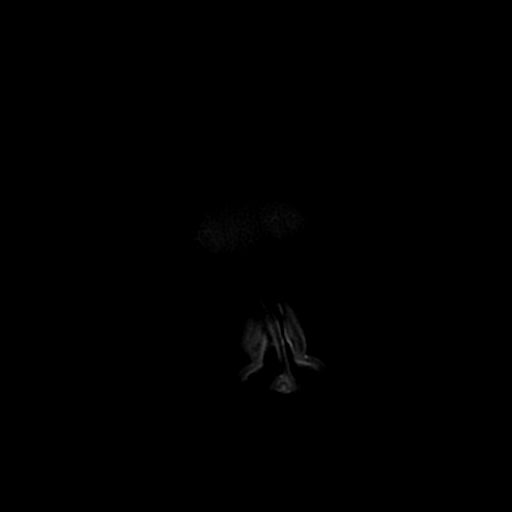
[im 4/22]
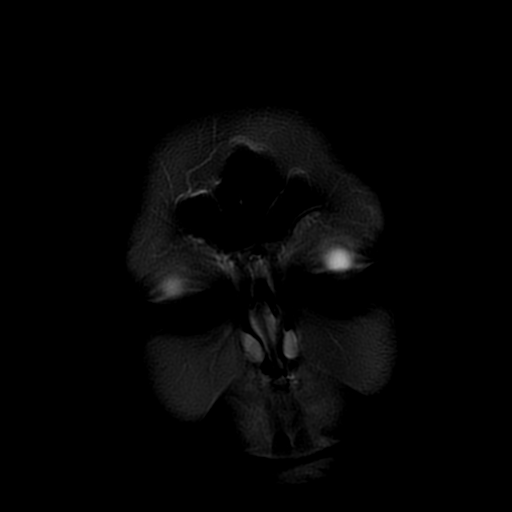
[im 7/22]
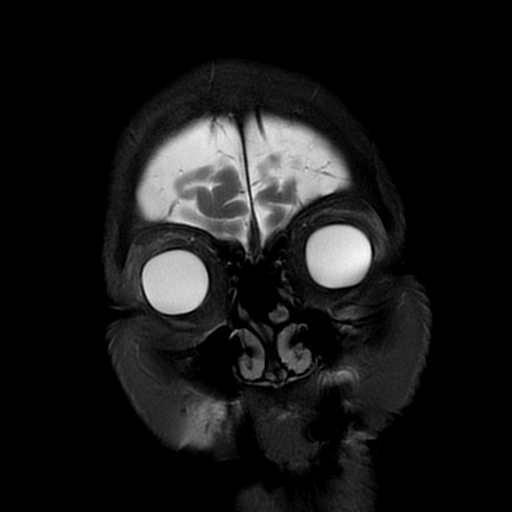
[im 10/22]
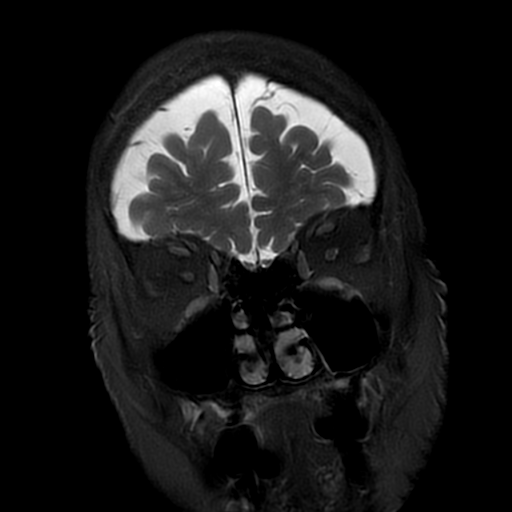
[im 13/22]
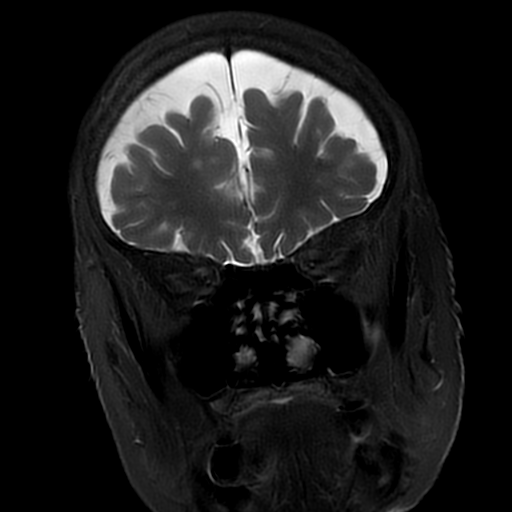
[im 16/22]
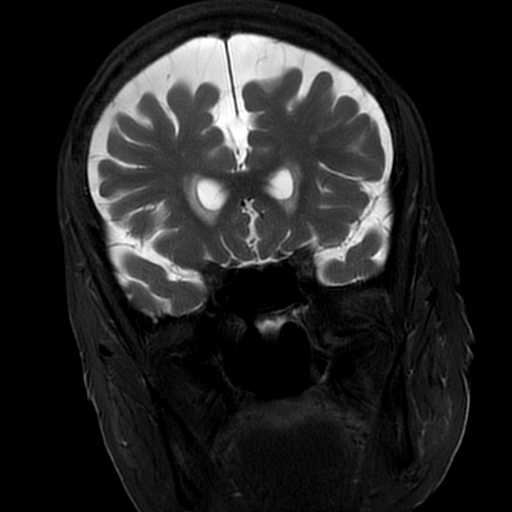
[im 19/22]
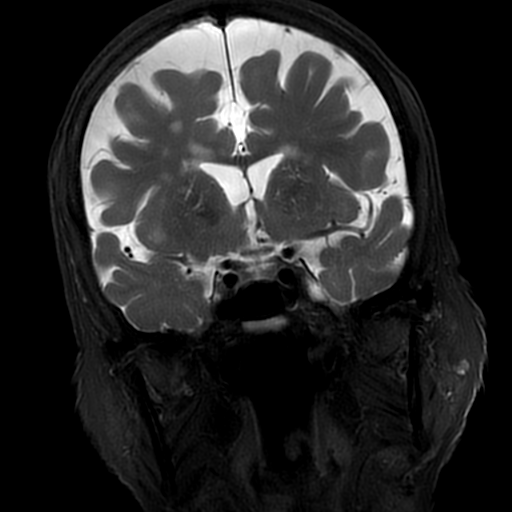
[im 22/22]
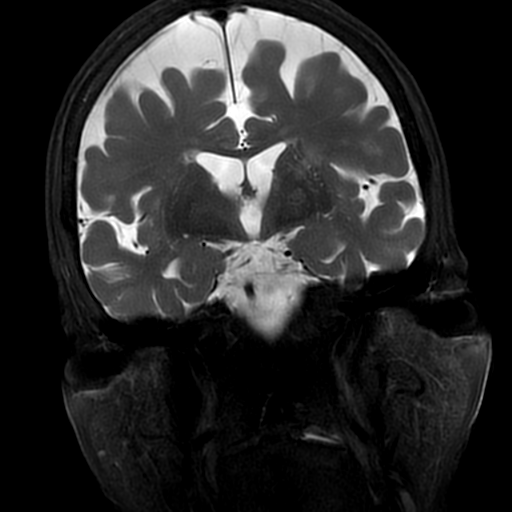

[Series 10: T2 fat-sat · axial · 3.0mm · 0.35mm/px · z∈[-55,-7]mm · 5 of 20 slices shown (2 of 2)]
[im 1/20]
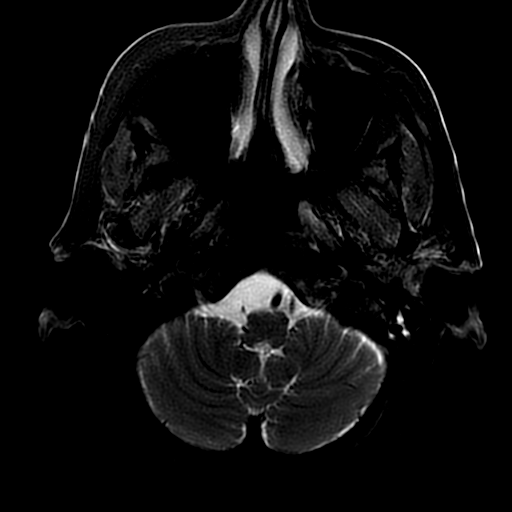
[im 3/20]
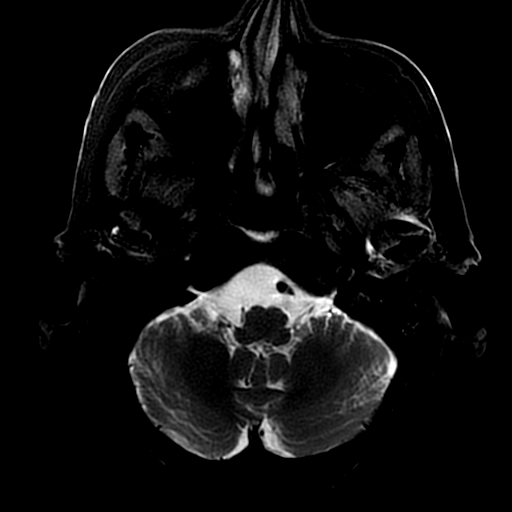
[im 6/20]
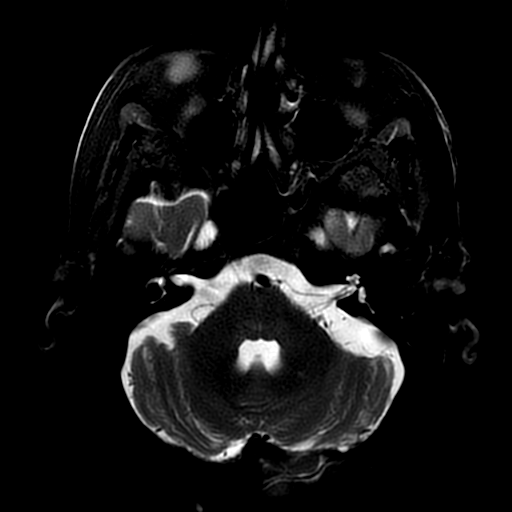
[im 11/20]
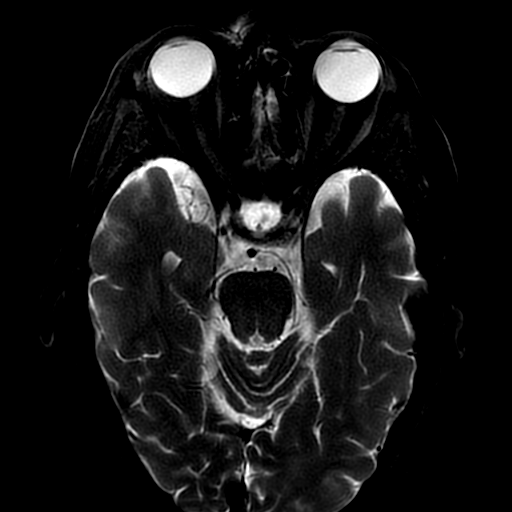
[im 17/20]
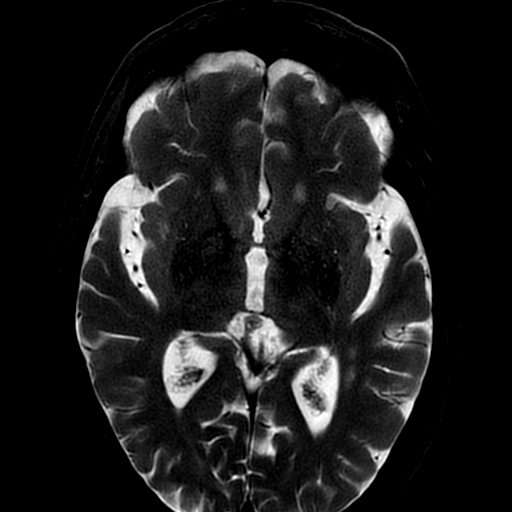

[Series 11: T1 · coronal · 4.0mm · 0.35mm/px · 3 of 22 slices shown (1 of 2)]
[im 4/22]
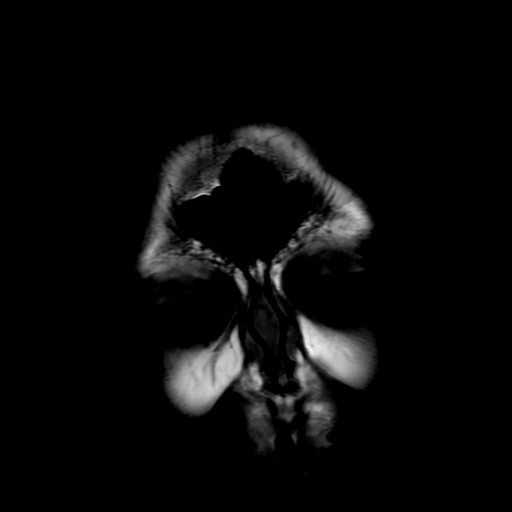
[im 13/22]
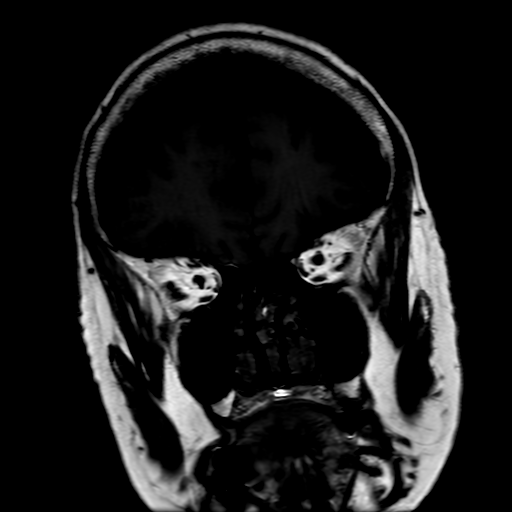
[im 19/22]
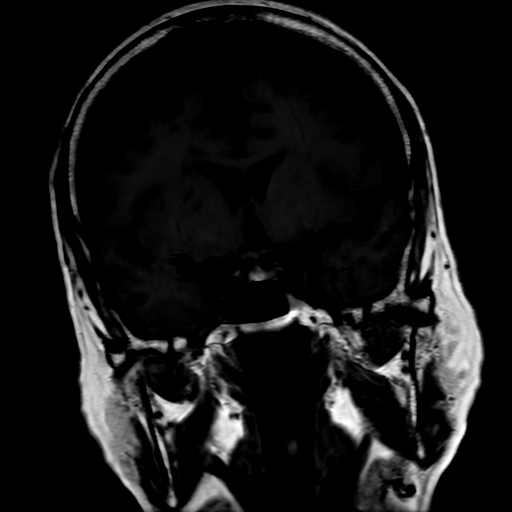

[Series 12: T1 · axial · 3.0mm · 0.35mm/px · z∈[-49,-7]mm · 3 of 20 slices shown (2 of 2)]
[im 3/20]
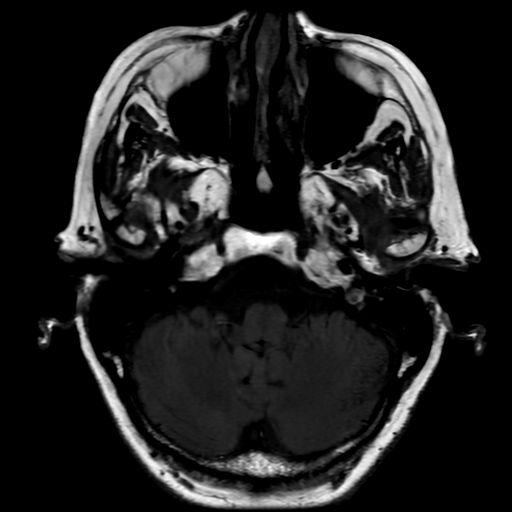
[im 11/20]
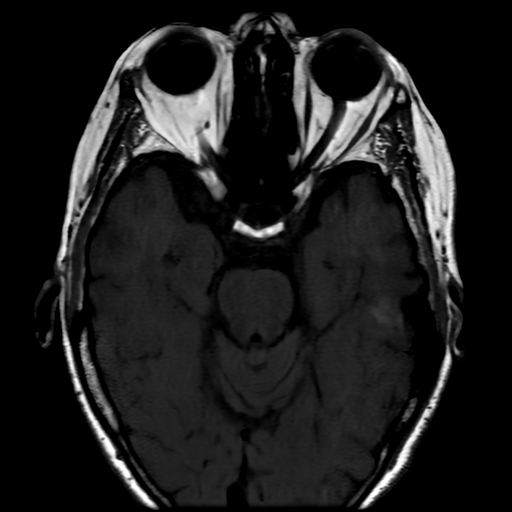
[im 17/20]
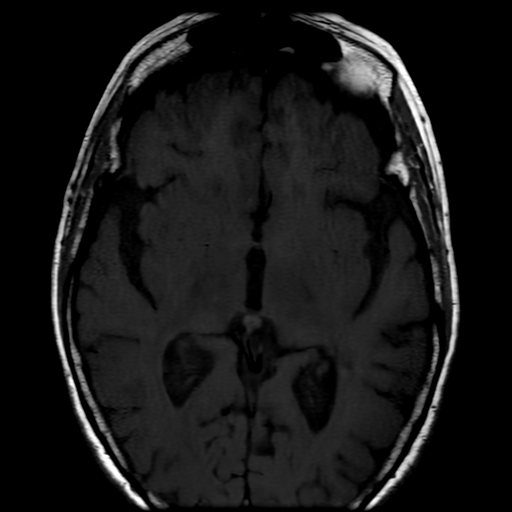

[19 of 48 positions shown; findings below may reference images not displayed]

FINDINGS: MRI HEAD FINDINGS

Brain: No acute infarct, mass effect or extra-axial collection. No
acute or chronic hemorrhage. Hyperintense T2-weighted signal is
moderately widespread throughout the white matter. Generalized
volume loss without a clear lobar predilection. The midline
structures are normal. There is no abnormal contrast enhancement.

Vascular: Major flow voids are preserved.

Skull and upper cervical spine: Normal calvarium and skull base.
Visualized upper cervical spine and soft tissues are normal.

Sinuses/Orbits:No paranasal sinus fluid levels or advanced mucosal
thickening. No mastoid or middle ear effusion. Normal orbits.

MRI ORBITS FINDINGS

Orbits:

--Globes: Normal.

--Bony orbit: Normal.

--Preseptal soft tissues: Normal.

--Intra- and extraconal orbital fat: Normal. No inflammatory
stranding.

--Optic nerves: Normal.

--Lacrimal glands and fossae: Normal.

--Extraocular muscles: Normal.

Visualized sinuses:  No fluid levels or advanced mucosal thickening.

Soft tissues: Normal.

Limited intracranial: Normal.
IMPRESSION: 1. No acute intracranial abnormality.
2. Normal MRI of the orbits.
3. Moderate chronic small vessel ischemic changes of the cerebral
white matter.

## 2021-01-17 MED ORDER — GADOBUTROL 1 MMOL/ML IV SOLN
6.5000 mL | Freq: Once | INTRAVENOUS | Status: AC | PRN
  Administered 2021-01-17: 6.5 mL via INTRAVENOUS

## 2021-01-17 MED ORDER — SODIUM CHLORIDE 0.9% FLUSH: 3.0000 mL | Freq: Once | INTRAVENOUS | Status: DC

## 2021-01-17 NOTE — ED Provider Notes (Signed)
Emergency Medicine Provider Triage Evaluation Note  Tina [name] , a 86 y.o. female  was evaluated in triage.  Pt complains of partial vision loss that began 3 weeks ago.  She was seen and evaluated at an ophthalmologist today and was referred to the emergency department secondary to ischemic optic changes.  He recommended MRI of the brain and orbits for further evaluation.  She was also being worked up for possible giant cell arteritis.  ESR was negative yesterday.  Review of Systems  Positive:  Negative: See above  Physical Exam  BP (!) 166/78 (BP Location: Right Arm)   Pulse 99   Temp 98.4 F (36.9 C) (Oral)   Resp 16   SpO2 98%  Gen:   Awake, no distress   Resp:  Normal effort  MSK:   Moves extremities without difficulty  Other:  Cranial nerves II through XII are intact.  5/5 strength to the upper and lower extremities.  Normal sensation to the upper lower extremities.  Noted statical kinesia on abduction and movements.  Normal peripheral field testing.  Medical Decision Making  Medically screening exam initiated at 6:28 PM.  Appropriate orders placed.  Tina O Prochnow was informed that the remainder of the evaluation will be completed by another provider, this initial triage assessment does not replace that evaluation, and the importance of remaining in the ED until their evaluation is complete.  MRI of brain and orbits were ordered per ophthalmologist recommendations.   Hendricks Limes, PA-C 01/17/21 1830    Davonna Belling, MD 01/17/21 2227

## 2021-01-17 NOTE — ED Notes (Signed)
PT called for room X3 with no response. PT not outside, nor in triage

## 2021-01-17 NOTE — ED Triage Notes (Signed)
Patient complains progressive partial loss of vision that started three weeks ago. Patient states she was seen at ophthalmologist and referred to ED for evaluation. Patient also reports having trouble with recollection and headaches that started at the same time. No other focal neurological deficit.

## 2021-01-18 NOTE — Discharge Instructions (Addendum)
You can try drinking saffron tea as a holistic approach for your vision changes, if your eye doctor agrees.

## 2021-01-18 NOTE — ED Provider Notes (Signed)
[facility] EMERGENCY DEPARTMENT Provider Note  CSN: [id number] Arrival date & time: 01/17/21 1800  Chief Complaint(s) Visual Field Change  HPI Tina [name] is a 86 y.o. female with a past medical history listed below including macular degeneration sent to the emergency department by her ophthalmologist due to vision loss of the lower nasal quadrant of the right eye.  Sent here to rule out possible stroke.  Visual disturbance has been worsening over the past several weeks.  No recent fevers or infections.  No trauma.  No focal deficits.  No alleviating or aggravating factors.  No other physical complaints.  Patient was already ruled out for temporal arteritis by the ophthalmologist.  HPI  Past Medical History Past Medical History:  Diagnosis Date   Allergy    Anxiety    Back pain    Blood transfusion without reported diagnosis    COPD (chronic obstructive pulmonary disease) (HCC)    GERD (gastroesophageal reflux disease)    Hypercholesteremia    IBS (irritable bowel syndrome)    Urinary incontinence    There are no problems to display for this patient.  Home Medication(s) Prior to Admission medications   Medication Sig Start Date End Date Taking? Authorizing Provider  acyclovir (ZOVIRAX) 800 MG tablet Take 800 mg by mouth as needed. Patient not taking: Reported on 04/04/2020    [provider]  aspirin EC 81 MG tablet Take 81 mg by mouth daily. Swallow whole. Patient not taking: Reported on 04/04/2020    [provider]  B Complex Vitamins (VITAMIN B COMPLEX PO) Take 1 tablet by mouth daily. Patient not taking: Reported on 04/04/2020    [provider]  calcium carbonate (TUMS - DOSED IN MG ELEMENTAL CALCIUM) 500 MG chewable tablet Chew 1 tablet by mouth as needed for indigestion or heartburn. Patient not taking: Reported on 04/04/2020    [provider]  CALCIUM PO Take 1 tablet by mouth daily.    [provider]  cholecalciferol (VITAMIN D3) 25 MCG (1000 UNIT) tablet Take 1,000 Units by mouth daily. Patient not taking: Reported on 04/04/2020    [provider]  Coenzyme Q10 (COQ10) 400 MG CAPS Take 1 capsule by mouth daily.    [provider]  conjugated estrogens (PREMARIN) vaginal cream Place 1 Applicatorful vaginally daily.    [provider]  CRANBERRY PO Take by mouth daily.    [provider]  denosumab (PROLIA) 60 MG/ML SOSY injection Inject 60 mg into the skin every 6 (six) months.    [provider]  DULoxetine (CYMBALTA) 60 MG capsule Take 60 mg by mouth daily.    [provider]  Fluticasone-Salmeterol (ADVAIR) 250-50 MCG/DOSE AEPB Inhale 1 puff into the lungs 2 (two) times daily.    [provider]  Magnesium Aspartate 65 MG TABS Take 1 tablet by mouth daily.    [provider]  Multiple Vitamins-Minerals (WOMENS MULTIVITAMIN PO) Take 1 tablet by mouth daily.    [provider]  multivitamin-lutein (OCUVITE-LUTEIN) CAPS capsule Take 1 capsule by mouth daily.    [provider]  omeprazole (PRILOSEC) 20 MG capsule Take 1 capsule (20 mg total) by mouth 2 (two) times daily before a meal. 03/22/20   Jackquline Denmark, MD  rosuvastatin (CRESTOR) 5 MG tablet Take 5 mg by mouth every other day.    [provider]  tiotropium (SPIRIVA) 18 MCG inhalation capsule Place 18 mcg into inhaler and inhale daily.  [provider]  trimethoprim (TRIMPEX) 100 MG tablet Take 100 mg by mouth daily. Patient not taking: Reported on 04/04/2020    [provider]                                                                                                                                    Past Surgical History Past Surgical History:  Procedure Laterality Date   ABDOMINAL HYSTERECTOMY     BLADDER REPAIR     BREAST EXCISIONAL BIOPSY Left    years ago   BREAST EXCISIONAL BIOPSY Right     CHOLECYSTECTOMY     COLONOSCOPY  07/02/2011   Moderate sigmoid divertuculosis. Small internal hemorrhoids. Otherwise normal colonsocopy to terminal ileum   Family History Family History  Problem Relation Age of Onset   Breast cancer Mother    Colon cancer Neg Hx    Esophageal cancer Neg Hx    Ovarian cancer Neg Hx    Pancreatic cancer Neg Hx    Prostate cancer Neg Hx    Rectal cancer Neg Hx     Social History Social History   Tobacco Use   Smoking status: Never   Smokeless tobacco: Never  Vaping Use   Vaping Use: Never used  Substance Use Topics   Alcohol use: Not Currently   Drug use: Never   Allergies Cefdinir, Levofloxacin, Nitrofurantoin, and Sulfa antibiotics  Review of Systems Review of Systems All other systems are reviewed and are negative for acute change except as noted in the HPI  Physical Exam Vital Signs  I have reviewed the triage vital signs BP (!) 148/90   Pulse 71   Temp 97.8 F (36.6 C) (Oral)   Resp 16   SpO2 100%   Physical Exam Vitals reviewed.  Constitutional:      General: She is not in acute distress.    Appearance: She is well-developed. She is not diaphoretic.  HENT:     Head: Normocephalic and atraumatic.     Right Ear: External ear normal.     Left Ear: External ear normal.     Nose: Nose normal.  Eyes:     General: No scleral icterus.    Conjunctiva/sclera: Conjunctivae normal.  Neck:     Trachea: Phonation normal.  Cardiovascular:     Rate and Rhythm: Normal rate and regular rhythm.  Pulmonary:     Effort: Pulmonary effort is normal. No respiratory distress.     Breath sounds: No stridor.  Abdominal:     General: There is no distension.  Musculoskeletal:        General: Normal range of motion.     Cervical back: Normal range of motion.  Neurological:     Mental Status: She is alert and oriented to person, place, and time.  Psychiatric:        Behavior: Behavior normal.    ED Results and Treatments Labs (all  labs  ordered are listed, but only abnormal results are displayed) Labs Reviewed  DIFFERENTIAL - Abnormal; Notable for the following components:      Result Value   Neutro Abs 7.8 (*)    All other components within normal limits  COMPREHENSIVE METABOLIC PANEL - Abnormal; Notable for the following components:   Sodium 134 (*)    Glucose, Bld 133 (*)    All other components within normal limits  I-STAT CHEM 8, ED - Abnormal; Notable for the following components:   Glucose, Bld 129 (*)    All other components within normal limits  PROTIME-INR  APTT  CBC  CBG MONITORING, ED                                                                                                                         EKG  EKG Interpretation  Date/Time:  Thursday January 17 2021 18:03:23 EDT Ventricular Rate:  97 PR Interval:  170 QRS Duration: 84 QT Interval:  362 QTC Calculation: 459 R Axis:   -26 Text Interpretation: Normal sinus rhythm Normal ECG Since last tracing rate faster Otherwise no significant change Confirmed by Addison Lank [phone]) on 01/18/2021 4:37:43 AM       Radiology CT HEAD WO CONTRAST  Result Date: 01/17/2021 CLINICAL DATA:  Acute stroke suspected EXAM: CT HEAD WITHOUT CONTRAST TECHNIQUE: Contiguous axial images were obtained from the base of the skull through the vertex without intravenous contrast. COMPARISON:  09/04/2010 brain MRI FINDINGS: Brain: No evidence of acute infarction, hemorrhage, hydrocephalus, extra-axial collection or mass lesion/mass effect. Cerebral volume loss since prior. Large extra-axial CSF density spaces but no cortical vein or cortex displacement to suggest collection. Vascular: No hyperdense vessel or unexpected calcification. Skull: Normal. Negative for fracture or focal lesion. Sinuses/Orbits: No acute finding. IMPRESSION: No acute finding. Electronically Signed   By: Jorje Guild M.D.   On: 01/17/2021 19:12   MR Brain W and Wo Contrast  Result Date:  01/17/2021 CLINICAL DATA:  Vision loss with monocular ischemic changes. EXAM: MRI HEAD AND ORBITS WITHOUT AND WITH CONTRAST TECHNIQUE: Multiplanar, multiecho pulse sequences of the brain and surrounding structures were obtained without and with intravenous contrast. Multiplanar, multiecho pulse sequences of the orbits and surrounding structures were obtained including fat saturation techniques, before and after intravenous contrast administration. CONTRAST:  6.56mL GADAVIST GADOBUTROL 1 MMOL/ML IV SOLN COMPARISON:  None. FINDINGS: MRI HEAD FINDINGS Brain: No acute infarct, mass effect or extra-axial collection. No acute or chronic hemorrhage. Hyperintense T2-weighted signal is moderately widespread throughout the white matter. Generalized volume loss without a clear lobar predilection. The midline structures are normal. There is no abnormal contrast enhancement. Vascular: Major flow voids are preserved. Skull and upper cervical spine: Normal calvarium and skull base. Visualized upper cervical spine and soft tissues are normal. Sinuses/Orbits:No paranasal sinus fluid levels or advanced mucosal thickening. No mastoid or middle ear effusion. Normal orbits. MRI ORBITS FINDINGS Orbits: --Globes: Normal. --Bony orbit: Normal. --Preseptal soft tissues: Normal. --Intra- and  extraconal orbital fat: Normal. No inflammatory stranding. --Optic nerves: Normal. --Lacrimal glands and fossae: Normal. --Extraocular muscles: Normal. Visualized sinuses:  No fluid levels or advanced mucosal thickening. Soft tissues: Normal. Limited intracranial: Normal. IMPRESSION: 1. No acute intracranial abnormality. 2. Normal MRI of the orbits. 3. Moderate chronic small vessel ischemic changes of the cerebral white matter. Electronically Signed   By: Ulyses Jarred M.D.   On: 01/17/2021 20:57   MR ORBITS W WO CONTRAST  Result Date: 01/17/2021 CLINICAL DATA:  Vision loss with monocular ischemic changes. EXAM: MRI HEAD AND ORBITS WITHOUT AND WITH  CONTRAST TECHNIQUE: Multiplanar, multiecho pulse sequences of the brain and surrounding structures were obtained without and with intravenous contrast. Multiplanar, multiecho pulse sequences of the orbits and surrounding structures were obtained including fat saturation techniques, before and after intravenous contrast administration. CONTRAST:  6.50mL GADAVIST GADOBUTROL 1 MMOL/ML IV SOLN COMPARISON:  None. FINDINGS: MRI HEAD FINDINGS Brain: No acute infarct, mass effect or extra-axial collection. No acute or chronic hemorrhage. Hyperintense T2-weighted signal is moderately widespread throughout the white matter. Generalized volume loss without a clear lobar predilection. The midline structures are normal. There is no abnormal contrast enhancement. Vascular: Major flow voids are preserved. Skull and upper cervical spine: Normal calvarium and skull base. Visualized upper cervical spine and soft tissues are normal. Sinuses/Orbits:No paranasal sinus fluid levels or advanced mucosal thickening. No mastoid or middle ear effusion. Normal orbits. MRI ORBITS FINDINGS Orbits: --Globes: Normal. --Bony orbit: Normal. --Preseptal soft tissues: Normal. --Intra- and extraconal orbital fat: Normal. No inflammatory stranding. --Optic nerves: Normal. --Lacrimal glands and fossae: Normal. --Extraocular muscles: Normal. Visualized sinuses:  No fluid levels or advanced mucosal thickening. Soft tissues: Normal. Limited intracranial: Normal. IMPRESSION: 1. No acute intracranial abnormality. 2. Normal MRI of the orbits. 3. Moderate chronic small vessel ischemic changes of the cerebral white matter. Electronically Signed   By: Ulyses Jarred M.D.   On: 01/17/2021 20:57    Pertinent labs & imaging results that were available during my care of the patient were reviewed by me and considered in my medical decision making (see MDM for details).  Medications Ordered in ED Medications  sodium chloride flush (NS) 0.9 % injection 3 mL (has no  administration in time range)  gadobutrol (GADAVIST) 1 MMOL/ML injection 6.5 mL (6.5 mLs Intravenous Contrast Given 01/17/21 2033)  gadobutrol (GADAVIST) 1 MMOL/ML injection 6.5 mL (6.5 mLs Intravenous Contrast Given 01/17/21 2042)                                                                                                                                     Procedures Procedures  (including critical care time)  Medical Decision Making / ED Course I have reviewed the nursing notes for this encounter and the patient's prior records (if available in EHR or on provided paperwork).  Tina O Przybylski was evaluated in Emergency Department on 01/18/2021 for the symptoms described in the  history of present illness. She was evaluated in the context of the global COVID-19 pandemic, which necessitated consideration that the patient might be at risk for infection with the SARS-CoV-2 virus that causes COVID-19. Institutional protocols and algorithms that pertain to the evaluation of patients at risk for COVID-19 are in a state of rapid change based on information released by regulatory bodies including the CDC and federal and state organizations. These policies and algorithms were followed during the patient's care in the ED.     MRI negative for stroke.  No optic disc abnormalities.  Labs reassuring. Recommended continued follow-up with the ophthalmologist.  Pertinent labs & imaging results that were available during my care of the patient were reviewed by me and considered in my medical decision making:    Final Clinical Impression(s) / ED Diagnoses Final diagnoses:  Visual field defect   The patient appears reasonably screened and/or stabilized for discharge and I doubt any other medical condition or other [facility] requiring further screening, evaluation, or treatment in the ED at this time prior to discharge. Safe for discharge with strict return precautions.  Disposition: Discharge  Condition:  Good  I have discussed the results, Dx and Tx plan with the patient/family who expressed understanding and agree(s) with the plan. Discharge instructions discussed at length. The patient/family was given strict return precautions who verbalized understanding of the instructions. No further questions at time of discharge.    ED Discharge Orders     None        Follow Up: Ophthamologist  Go to  as scheduled     This chart was dictated using voice recognition software.  Despite best efforts to proofread,  errors can occur which can change the documentation meaning.    Fatima Blank, MD 01/18/21 [phone]

## 2021-01-19 DIAGNOSIS — J449 Chronic obstructive pulmonary disease, unspecified: Secondary | ICD-10-CM | POA: Diagnosis not present

## 2021-01-19 DIAGNOSIS — M810 Age-related osteoporosis without current pathological fracture: Secondary | ICD-10-CM | POA: Diagnosis not present

## 2021-01-19 DIAGNOSIS — M1990 Unspecified osteoarthritis, unspecified site: Secondary | ICD-10-CM | POA: Diagnosis not present

## 2021-01-25 DIAGNOSIS — R413 Other amnesia: Secondary | ICD-10-CM | POA: Diagnosis not present

## 2021-01-25 DIAGNOSIS — E782 Mixed hyperlipidemia: Secondary | ICD-10-CM | POA: Diagnosis not present

## 2021-01-25 DIAGNOSIS — H53139 Sudden visual loss, unspecified eye: Secondary | ICD-10-CM | POA: Diagnosis not present

## 2021-01-25 DIAGNOSIS — F339 Major depressive disorder, recurrent, unspecified: Secondary | ICD-10-CM | POA: Diagnosis not present

## 2021-02-01 DIAGNOSIS — Z23 Encounter for immunization: Secondary | ICD-10-CM | POA: Diagnosis not present

## 2021-02-02 DIAGNOSIS — Z20822 Contact with and (suspected) exposure to covid-19: Secondary | ICD-10-CM | POA: Diagnosis not present

## 2021-02-02 DIAGNOSIS — U071 COVID-19: Secondary | ICD-10-CM | POA: Diagnosis not present

## 2021-02-13 DIAGNOSIS — J449 Chronic obstructive pulmonary disease, unspecified: Secondary | ICD-10-CM | POA: Diagnosis not present

## 2021-02-13 DIAGNOSIS — Z6824 Body mass index (BMI) 24.0-24.9, adult: Secondary | ICD-10-CM | POA: Diagnosis not present

## 2021-02-13 DIAGNOSIS — Z8616 Personal history of COVID-19: Secondary | ICD-10-CM | POA: Diagnosis not present

## 2021-02-16 DIAGNOSIS — Z760 Encounter for issue of repeat prescription: Secondary | ICD-10-CM | POA: Diagnosis not present

## 2021-02-19 DIAGNOSIS — J449 Chronic obstructive pulmonary disease, unspecified: Secondary | ICD-10-CM | POA: Diagnosis not present

## 2021-02-19 DIAGNOSIS — M810 Age-related osteoporosis without current pathological fracture: Secondary | ICD-10-CM | POA: Diagnosis not present

## 2021-02-19 DIAGNOSIS — M1990 Unspecified osteoarthritis, unspecified site: Secondary | ICD-10-CM | POA: Diagnosis not present

## 2021-02-23 DIAGNOSIS — S91302A Unspecified open wound, left foot, initial encounter: Secondary | ICD-10-CM | POA: Diagnosis not present

## 2021-03-08 DIAGNOSIS — R300 Dysuria: Secondary | ICD-10-CM | POA: Diagnosis not present

## 2021-03-08 DIAGNOSIS — N390 Urinary tract infection, site not specified: Secondary | ICD-10-CM | POA: Diagnosis not present

## 2021-03-21 DIAGNOSIS — M1990 Unspecified osteoarthritis, unspecified site: Secondary | ICD-10-CM | POA: Diagnosis not present

## 2021-03-21 DIAGNOSIS — M810 Age-related osteoporosis without current pathological fracture: Secondary | ICD-10-CM | POA: Diagnosis not present

## 2021-03-21 DIAGNOSIS — J449 Chronic obstructive pulmonary disease, unspecified: Secondary | ICD-10-CM | POA: Diagnosis not present

## 2021-03-22 DIAGNOSIS — H47011 Ischemic optic neuropathy, right eye: Secondary | ICD-10-CM | POA: Diagnosis not present

## 2021-03-26 ENCOUNTER — Other Ambulatory Visit: Payer: Self-pay | Admitting: Gastroenterology

## 2021-03-29 DIAGNOSIS — R5383 Other fatigue: Secondary | ICD-10-CM | POA: Diagnosis not present

## 2021-03-29 DIAGNOSIS — D692 Other nonthrombocytopenic purpura: Secondary | ICD-10-CM | POA: Diagnosis not present

## 2021-03-29 DIAGNOSIS — F419 Anxiety disorder, unspecified: Secondary | ICD-10-CM | POA: Diagnosis not present

## 2021-03-29 DIAGNOSIS — Z6824 Body mass index (BMI) 24.0-24.9, adult: Secondary | ICD-10-CM | POA: Diagnosis not present

## 2021-04-04 DIAGNOSIS — H5461 Unqualified visual loss, right eye, normal vision left eye: Secondary | ICD-10-CM | POA: Diagnosis not present

## 2021-04-04 DIAGNOSIS — H53131 Sudden visual loss, right eye: Secondary | ICD-10-CM | POA: Diagnosis not present

## 2021-04-04 DIAGNOSIS — H53139 Sudden visual loss, unspecified eye: Secondary | ICD-10-CM | POA: Diagnosis not present

## 2021-04-08 DIAGNOSIS — H53139 Sudden visual loss, unspecified eye: Secondary | ICD-10-CM | POA: Diagnosis not present

## 2021-04-21 DIAGNOSIS — M810 Age-related osteoporosis without current pathological fracture: Secondary | ICD-10-CM | POA: Diagnosis not present

## 2021-04-21 DIAGNOSIS — E782 Mixed hyperlipidemia: Secondary | ICD-10-CM | POA: Diagnosis not present

## 2021-04-21 DIAGNOSIS — F339 Major depressive disorder, recurrent, unspecified: Secondary | ICD-10-CM | POA: Diagnosis not present

## 2021-04-21 DIAGNOSIS — J449 Chronic obstructive pulmonary disease, unspecified: Secondary | ICD-10-CM | POA: Diagnosis not present

## 2021-04-25 DIAGNOSIS — H53139 Sudden visual loss, unspecified eye: Secondary | ICD-10-CM | POA: Diagnosis not present

## 2021-04-25 DIAGNOSIS — M7551 Bursitis of right shoulder: Secondary | ICD-10-CM | POA: Diagnosis not present

## 2021-04-25 DIAGNOSIS — Z6824 Body mass index (BMI) 24.0-24.9, adult: Secondary | ICD-10-CM | POA: Diagnosis not present

## 2021-05-13 DIAGNOSIS — M25511 Pain in right shoulder: Secondary | ICD-10-CM | POA: Diagnosis not present

## 2021-05-20 DIAGNOSIS — M7541 Impingement syndrome of right shoulder: Secondary | ICD-10-CM | POA: Diagnosis not present

## 2021-05-20 DIAGNOSIS — M25611 Stiffness of right shoulder, not elsewhere classified: Secondary | ICD-10-CM | POA: Diagnosis not present

## 2021-05-20 DIAGNOSIS — R531 Weakness: Secondary | ICD-10-CM | POA: Diagnosis not present

## 2021-05-20 DIAGNOSIS — M25511 Pain in right shoulder: Secondary | ICD-10-CM | POA: Diagnosis not present

## 2021-05-22 DIAGNOSIS — J449 Chronic obstructive pulmonary disease, unspecified: Secondary | ICD-10-CM | POA: Diagnosis not present

## 2021-05-22 DIAGNOSIS — M810 Age-related osteoporosis without current pathological fracture: Secondary | ICD-10-CM | POA: Diagnosis not present

## 2021-05-22 DIAGNOSIS — E782 Mixed hyperlipidemia: Secondary | ICD-10-CM | POA: Diagnosis not present

## 2021-05-22 DIAGNOSIS — F339 Major depressive disorder, recurrent, unspecified: Secondary | ICD-10-CM | POA: Diagnosis not present

## 2021-05-24 DIAGNOSIS — M25511 Pain in right shoulder: Secondary | ICD-10-CM | POA: Diagnosis not present

## 2021-05-24 DIAGNOSIS — M7541 Impingement syndrome of right shoulder: Secondary | ICD-10-CM | POA: Diagnosis not present

## 2021-05-24 DIAGNOSIS — R531 Weakness: Secondary | ICD-10-CM | POA: Diagnosis not present

## 2021-05-24 DIAGNOSIS — M25611 Stiffness of right shoulder, not elsewhere classified: Secondary | ICD-10-CM | POA: Diagnosis not present

## 2021-05-29 DIAGNOSIS — N3090 Cystitis, unspecified without hematuria: Secondary | ICD-10-CM | POA: Diagnosis not present

## 2021-05-29 DIAGNOSIS — N952 Postmenopausal atrophic vaginitis: Secondary | ICD-10-CM | POA: Diagnosis not present

## 2021-05-29 DIAGNOSIS — N3281 Overactive bladder: Secondary | ICD-10-CM | POA: Diagnosis not present

## 2021-06-05 DIAGNOSIS — M25611 Stiffness of right shoulder, not elsewhere classified: Secondary | ICD-10-CM | POA: Diagnosis not present

## 2021-06-05 DIAGNOSIS — R531 Weakness: Secondary | ICD-10-CM | POA: Diagnosis not present

## 2021-06-05 DIAGNOSIS — M25511 Pain in right shoulder: Secondary | ICD-10-CM | POA: Diagnosis not present

## 2021-06-05 DIAGNOSIS — M7541 Impingement syndrome of right shoulder: Secondary | ICD-10-CM | POA: Diagnosis not present

## 2021-06-07 DIAGNOSIS — M25611 Stiffness of right shoulder, not elsewhere classified: Secondary | ICD-10-CM | POA: Diagnosis not present

## 2021-06-07 DIAGNOSIS — R531 Weakness: Secondary | ICD-10-CM | POA: Diagnosis not present

## 2021-06-07 DIAGNOSIS — M25511 Pain in right shoulder: Secondary | ICD-10-CM | POA: Diagnosis not present

## 2021-06-07 DIAGNOSIS — M7541 Impingement syndrome of right shoulder: Secondary | ICD-10-CM | POA: Diagnosis not present

## 2021-06-12 DIAGNOSIS — M7541 Impingement syndrome of right shoulder: Secondary | ICD-10-CM | POA: Diagnosis not present

## 2021-06-12 DIAGNOSIS — M25611 Stiffness of right shoulder, not elsewhere classified: Secondary | ICD-10-CM | POA: Diagnosis not present

## 2021-06-12 DIAGNOSIS — R531 Weakness: Secondary | ICD-10-CM | POA: Diagnosis not present

## 2021-06-12 DIAGNOSIS — M25511 Pain in right shoulder: Secondary | ICD-10-CM | POA: Diagnosis not present

## 2021-06-17 DIAGNOSIS — M25511 Pain in right shoulder: Secondary | ICD-10-CM | POA: Diagnosis not present

## 2021-06-17 DIAGNOSIS — M7541 Impingement syndrome of right shoulder: Secondary | ICD-10-CM | POA: Diagnosis not present

## 2021-06-17 DIAGNOSIS — M25611 Stiffness of right shoulder, not elsewhere classified: Secondary | ICD-10-CM | POA: Diagnosis not present

## 2021-06-17 DIAGNOSIS — R531 Weakness: Secondary | ICD-10-CM | POA: Diagnosis not present

## 2021-06-19 DIAGNOSIS — M810 Age-related osteoporosis without current pathological fracture: Secondary | ICD-10-CM | POA: Diagnosis not present

## 2021-06-19 DIAGNOSIS — E782 Mixed hyperlipidemia: Secondary | ICD-10-CM | POA: Diagnosis not present

## 2021-06-19 DIAGNOSIS — F339 Major depressive disorder, recurrent, unspecified: Secondary | ICD-10-CM | POA: Diagnosis not present

## 2021-06-19 DIAGNOSIS — J449 Chronic obstructive pulmonary disease, unspecified: Secondary | ICD-10-CM | POA: Diagnosis not present

## 2021-06-24 DIAGNOSIS — R531 Weakness: Secondary | ICD-10-CM | POA: Diagnosis not present

## 2021-06-24 DIAGNOSIS — M25611 Stiffness of right shoulder, not elsewhere classified: Secondary | ICD-10-CM | POA: Diagnosis not present

## 2021-06-24 DIAGNOSIS — M7541 Impingement syndrome of right shoulder: Secondary | ICD-10-CM | POA: Diagnosis not present

## 2021-06-24 DIAGNOSIS — M25511 Pain in right shoulder: Secondary | ICD-10-CM | POA: Diagnosis not present

## 2021-07-01 DIAGNOSIS — R531 Weakness: Secondary | ICD-10-CM | POA: Diagnosis not present

## 2021-07-01 DIAGNOSIS — M25511 Pain in right shoulder: Secondary | ICD-10-CM | POA: Diagnosis not present

## 2021-07-01 DIAGNOSIS — M7541 Impingement syndrome of right shoulder: Secondary | ICD-10-CM | POA: Diagnosis not present

## 2021-07-01 DIAGNOSIS — M25611 Stiffness of right shoulder, not elsewhere classified: Secondary | ICD-10-CM | POA: Diagnosis not present

## 2021-07-04 DIAGNOSIS — H52223 Regular astigmatism, bilateral: Secondary | ICD-10-CM | POA: Diagnosis not present

## 2021-07-04 DIAGNOSIS — H353132 Nonexudative age-related macular degeneration, bilateral, intermediate dry stage: Secondary | ICD-10-CM | POA: Diagnosis not present

## 2021-07-04 DIAGNOSIS — Z961 Presence of intraocular lens: Secondary | ICD-10-CM | POA: Diagnosis not present

## 2021-07-04 DIAGNOSIS — H35372 Puckering of macula, left eye: Secondary | ICD-10-CM | POA: Diagnosis not present

## 2021-07-04 DIAGNOSIS — H524 Presbyopia: Secondary | ICD-10-CM | POA: Diagnosis not present

## 2021-07-04 DIAGNOSIS — H5203 Hypermetropia, bilateral: Secondary | ICD-10-CM | POA: Diagnosis not present

## 2021-07-04 DIAGNOSIS — H26493 Other secondary cataract, bilateral: Secondary | ICD-10-CM | POA: Diagnosis not present

## 2021-07-08 DIAGNOSIS — R531 Weakness: Secondary | ICD-10-CM | POA: Diagnosis not present

## 2021-07-08 DIAGNOSIS — M25611 Stiffness of right shoulder, not elsewhere classified: Secondary | ICD-10-CM | POA: Diagnosis not present

## 2021-07-08 DIAGNOSIS — M6530 Trigger finger, unspecified finger: Secondary | ICD-10-CM | POA: Diagnosis not present

## 2021-07-08 DIAGNOSIS — M720 Palmar fascial fibromatosis [Dupuytren]: Secondary | ICD-10-CM | POA: Diagnosis not present

## 2021-07-08 DIAGNOSIS — Z6824 Body mass index (BMI) 24.0-24.9, adult: Secondary | ICD-10-CM | POA: Diagnosis not present

## 2021-07-08 DIAGNOSIS — M7541 Impingement syndrome of right shoulder: Secondary | ICD-10-CM | POA: Diagnosis not present

## 2021-07-08 DIAGNOSIS — M25511 Pain in right shoulder: Secondary | ICD-10-CM | POA: Diagnosis not present

## 2021-07-15 DIAGNOSIS — M25611 Stiffness of right shoulder, not elsewhere classified: Secondary | ICD-10-CM | POA: Diagnosis not present

## 2021-07-15 DIAGNOSIS — M7541 Impingement syndrome of right shoulder: Secondary | ICD-10-CM | POA: Diagnosis not present

## 2021-07-15 DIAGNOSIS — R531 Weakness: Secondary | ICD-10-CM | POA: Diagnosis not present

## 2021-07-15 DIAGNOSIS — M25511 Pain in right shoulder: Secondary | ICD-10-CM | POA: Diagnosis not present

## 2021-07-17 DIAGNOSIS — H04123 Dry eye syndrome of bilateral lacrimal glands: Secondary | ICD-10-CM | POA: Diagnosis not present

## 2021-07-17 DIAGNOSIS — H47011 Ischemic optic neuropathy, right eye: Secondary | ICD-10-CM | POA: Diagnosis not present

## 2021-07-17 DIAGNOSIS — H353132 Nonexudative age-related macular degeneration, bilateral, intermediate dry stage: Secondary | ICD-10-CM | POA: Diagnosis not present

## 2021-07-20 DIAGNOSIS — F339 Major depressive disorder, recurrent, unspecified: Secondary | ICD-10-CM | POA: Diagnosis not present

## 2021-07-20 DIAGNOSIS — D692 Other nonthrombocytopenic purpura: Secondary | ICD-10-CM | POA: Diagnosis not present

## 2021-07-22 DIAGNOSIS — M720 Palmar fascial fibromatosis [Dupuytren]: Secondary | ICD-10-CM | POA: Diagnosis not present

## 2021-07-22 DIAGNOSIS — M65332 Trigger finger, left middle finger: Secondary | ICD-10-CM | POA: Diagnosis not present

## 2021-08-15 DIAGNOSIS — Z0100 Encounter for examination of eyes and vision without abnormal findings: Secondary | ICD-10-CM | POA: Diagnosis not present

## 2021-08-21 DIAGNOSIS — M720 Palmar fascial fibromatosis [Dupuytren]: Secondary | ICD-10-CM | POA: Diagnosis not present

## 2021-09-04 DIAGNOSIS — M65332 Trigger finger, left middle finger: Secondary | ICD-10-CM | POA: Diagnosis not present

## 2021-09-04 DIAGNOSIS — M720 Palmar fascial fibromatosis [Dupuytren]: Secondary | ICD-10-CM | POA: Diagnosis not present

## 2021-09-19 DIAGNOSIS — M720 Palmar fascial fibromatosis [Dupuytren]: Secondary | ICD-10-CM | POA: Diagnosis not present

## 2021-09-19 DIAGNOSIS — M65332 Trigger finger, left middle finger: Secondary | ICD-10-CM | POA: Diagnosis not present

## 2021-09-19 DIAGNOSIS — F40231 Fear of injections and transfusions: Secondary | ICD-10-CM | POA: Diagnosis not present

## 2021-09-19 DIAGNOSIS — D692 Other nonthrombocytopenic purpura: Secondary | ICD-10-CM | POA: Diagnosis not present

## 2021-09-19 DIAGNOSIS — F339 Major depressive disorder, recurrent, unspecified: Secondary | ICD-10-CM | POA: Diagnosis not present

## 2021-09-19 DIAGNOSIS — M654 Radial styloid tenosynovitis [de Quervain]: Secondary | ICD-10-CM | POA: Diagnosis not present

## 2021-09-23 DIAGNOSIS — M720 Palmar fascial fibromatosis [Dupuytren]: Secondary | ICD-10-CM | POA: Diagnosis not present

## 2021-10-02 DIAGNOSIS — M25442 Effusion, left hand: Secondary | ICD-10-CM | POA: Diagnosis not present

## 2021-10-02 DIAGNOSIS — M79642 Pain in left hand: Secondary | ICD-10-CM | POA: Diagnosis not present

## 2021-10-02 DIAGNOSIS — Z4789 Encounter for other orthopedic aftercare: Secondary | ICD-10-CM | POA: Diagnosis not present

## 2021-10-02 DIAGNOSIS — R202 Paresthesia of skin: Secondary | ICD-10-CM | POA: Diagnosis not present

## 2021-10-02 DIAGNOSIS — R531 Weakness: Secondary | ICD-10-CM | POA: Diagnosis not present

## 2021-10-02 DIAGNOSIS — M25642 Stiffness of left hand, not elsewhere classified: Secondary | ICD-10-CM | POA: Diagnosis not present

## 2021-10-03 DIAGNOSIS — R202 Paresthesia of skin: Secondary | ICD-10-CM | POA: Diagnosis not present

## 2021-10-03 DIAGNOSIS — M25642 Stiffness of left hand, not elsewhere classified: Secondary | ICD-10-CM | POA: Diagnosis not present

## 2021-10-03 DIAGNOSIS — R531 Weakness: Secondary | ICD-10-CM | POA: Diagnosis not present

## 2021-10-03 DIAGNOSIS — M79642 Pain in left hand: Secondary | ICD-10-CM | POA: Diagnosis not present

## 2021-10-03 DIAGNOSIS — Z4789 Encounter for other orthopedic aftercare: Secondary | ICD-10-CM | POA: Diagnosis not present

## 2021-10-03 DIAGNOSIS — M25442 Effusion, left hand: Secondary | ICD-10-CM | POA: Diagnosis not present

## 2021-10-04 DIAGNOSIS — M720 Palmar fascial fibromatosis [Dupuytren]: Secondary | ICD-10-CM | POA: Diagnosis not present

## 2021-10-08 DIAGNOSIS — M79642 Pain in left hand: Secondary | ICD-10-CM | POA: Diagnosis not present

## 2021-10-08 DIAGNOSIS — M25642 Stiffness of left hand, not elsewhere classified: Secondary | ICD-10-CM | POA: Diagnosis not present

## 2021-10-08 DIAGNOSIS — M25442 Effusion, left hand: Secondary | ICD-10-CM | POA: Diagnosis not present

## 2021-10-08 DIAGNOSIS — R202 Paresthesia of skin: Secondary | ICD-10-CM | POA: Diagnosis not present

## 2021-10-08 DIAGNOSIS — R531 Weakness: Secondary | ICD-10-CM | POA: Diagnosis not present

## 2021-10-08 DIAGNOSIS — Z4789 Encounter for other orthopedic aftercare: Secondary | ICD-10-CM | POA: Diagnosis not present

## 2021-10-10 DIAGNOSIS — R202 Paresthesia of skin: Secondary | ICD-10-CM | POA: Diagnosis not present

## 2021-10-10 DIAGNOSIS — Z4789 Encounter for other orthopedic aftercare: Secondary | ICD-10-CM | POA: Diagnosis not present

## 2021-10-10 DIAGNOSIS — R531 Weakness: Secondary | ICD-10-CM | POA: Diagnosis not present

## 2021-10-10 DIAGNOSIS — M79642 Pain in left hand: Secondary | ICD-10-CM | POA: Diagnosis not present

## 2021-10-10 DIAGNOSIS — M25442 Effusion, left hand: Secondary | ICD-10-CM | POA: Diagnosis not present

## 2021-10-10 DIAGNOSIS — M25642 Stiffness of left hand, not elsewhere classified: Secondary | ICD-10-CM | POA: Diagnosis not present

## 2021-10-14 DIAGNOSIS — E782 Mixed hyperlipidemia: Secondary | ICD-10-CM | POA: Diagnosis not present

## 2021-10-15 DIAGNOSIS — R202 Paresthesia of skin: Secondary | ICD-10-CM | POA: Diagnosis not present

## 2021-10-15 DIAGNOSIS — M25442 Effusion, left hand: Secondary | ICD-10-CM | POA: Diagnosis not present

## 2021-10-15 DIAGNOSIS — R531 Weakness: Secondary | ICD-10-CM | POA: Diagnosis not present

## 2021-10-15 DIAGNOSIS — Z4789 Encounter for other orthopedic aftercare: Secondary | ICD-10-CM | POA: Diagnosis not present

## 2021-10-15 DIAGNOSIS — M79642 Pain in left hand: Secondary | ICD-10-CM | POA: Diagnosis not present

## 2021-10-15 DIAGNOSIS — M25642 Stiffness of left hand, not elsewhere classified: Secondary | ICD-10-CM | POA: Diagnosis not present

## 2021-10-17 DIAGNOSIS — Z4789 Encounter for other orthopedic aftercare: Secondary | ICD-10-CM | POA: Diagnosis not present

## 2021-10-17 DIAGNOSIS — M25442 Effusion, left hand: Secondary | ICD-10-CM | POA: Diagnosis not present

## 2021-10-17 DIAGNOSIS — M79642 Pain in left hand: Secondary | ICD-10-CM | POA: Diagnosis not present

## 2021-10-17 DIAGNOSIS — M25642 Stiffness of left hand, not elsewhere classified: Secondary | ICD-10-CM | POA: Diagnosis not present

## 2021-10-17 DIAGNOSIS — R202 Paresthesia of skin: Secondary | ICD-10-CM | POA: Diagnosis not present

## 2021-10-17 DIAGNOSIS — R531 Weakness: Secondary | ICD-10-CM | POA: Diagnosis not present

## 2021-10-19 DIAGNOSIS — F339 Major depressive disorder, recurrent, unspecified: Secondary | ICD-10-CM | POA: Diagnosis not present

## 2021-10-19 DIAGNOSIS — D692 Other nonthrombocytopenic purpura: Secondary | ICD-10-CM | POA: Diagnosis not present

## 2021-10-21 DIAGNOSIS — M25642 Stiffness of left hand, not elsewhere classified: Secondary | ICD-10-CM | POA: Diagnosis not present

## 2021-10-21 DIAGNOSIS — R531 Weakness: Secondary | ICD-10-CM | POA: Diagnosis not present

## 2021-10-21 DIAGNOSIS — R202 Paresthesia of skin: Secondary | ICD-10-CM | POA: Diagnosis not present

## 2021-10-21 DIAGNOSIS — M25442 Effusion, left hand: Secondary | ICD-10-CM | POA: Diagnosis not present

## 2021-10-21 DIAGNOSIS — Z4789 Encounter for other orthopedic aftercare: Secondary | ICD-10-CM | POA: Diagnosis not present

## 2021-10-21 DIAGNOSIS — M79642 Pain in left hand: Secondary | ICD-10-CM | POA: Diagnosis not present

## 2021-10-24 DIAGNOSIS — R202 Paresthesia of skin: Secondary | ICD-10-CM | POA: Diagnosis not present

## 2021-10-24 DIAGNOSIS — M79642 Pain in left hand: Secondary | ICD-10-CM | POA: Diagnosis not present

## 2021-10-24 DIAGNOSIS — M25642 Stiffness of left hand, not elsewhere classified: Secondary | ICD-10-CM | POA: Diagnosis not present

## 2021-10-24 DIAGNOSIS — R531 Weakness: Secondary | ICD-10-CM | POA: Diagnosis not present

## 2021-10-24 DIAGNOSIS — M25442 Effusion, left hand: Secondary | ICD-10-CM | POA: Diagnosis not present

## 2021-10-24 DIAGNOSIS — Z4789 Encounter for other orthopedic aftercare: Secondary | ICD-10-CM | POA: Diagnosis not present

## 2021-10-29 ENCOUNTER — Other Ambulatory Visit: Payer: Self-pay | Admitting: Family Medicine

## 2021-10-29 DIAGNOSIS — F339 Major depressive disorder, recurrent, unspecified: Secondary | ICD-10-CM | POA: Diagnosis not present

## 2021-10-29 DIAGNOSIS — M810 Age-related osteoporosis without current pathological fracture: Secondary | ICD-10-CM | POA: Diagnosis not present

## 2021-10-29 DIAGNOSIS — R202 Paresthesia of skin: Secondary | ICD-10-CM | POA: Diagnosis not present

## 2021-10-29 DIAGNOSIS — M25442 Effusion, left hand: Secondary | ICD-10-CM | POA: Diagnosis not present

## 2021-10-29 DIAGNOSIS — J449 Chronic obstructive pulmonary disease, unspecified: Secondary | ICD-10-CM | POA: Diagnosis not present

## 2021-10-29 DIAGNOSIS — Z1339 Encounter for screening examination for other mental health and behavioral disorders: Secondary | ICD-10-CM | POA: Diagnosis not present

## 2021-10-29 DIAGNOSIS — R531 Weakness: Secondary | ICD-10-CM | POA: Diagnosis not present

## 2021-10-29 DIAGNOSIS — M79642 Pain in left hand: Secondary | ICD-10-CM | POA: Diagnosis not present

## 2021-10-29 DIAGNOSIS — Z6825 Body mass index (BMI) 25.0-25.9, adult: Secondary | ICD-10-CM | POA: Diagnosis not present

## 2021-10-29 DIAGNOSIS — Z139 Encounter for screening, unspecified: Secondary | ICD-10-CM | POA: Diagnosis not present

## 2021-10-29 DIAGNOSIS — Z0000 Encounter for general adult medical examination without abnormal findings: Secondary | ICD-10-CM | POA: Diagnosis not present

## 2021-10-29 DIAGNOSIS — Z1331 Encounter for screening for depression: Secondary | ICD-10-CM | POA: Diagnosis not present

## 2021-10-29 DIAGNOSIS — Z136 Encounter for screening for cardiovascular disorders: Secondary | ICD-10-CM | POA: Diagnosis not present

## 2021-10-29 DIAGNOSIS — M25642 Stiffness of left hand, not elsewhere classified: Secondary | ICD-10-CM | POA: Diagnosis not present

## 2021-10-29 DIAGNOSIS — D692 Other nonthrombocytopenic purpura: Secondary | ICD-10-CM | POA: Diagnosis not present

## 2021-10-29 DIAGNOSIS — Z1231 Encounter for screening mammogram for malignant neoplasm of breast: Secondary | ICD-10-CM

## 2021-10-29 DIAGNOSIS — Z4789 Encounter for other orthopedic aftercare: Secondary | ICD-10-CM | POA: Diagnosis not present

## 2021-11-01 DIAGNOSIS — M79642 Pain in left hand: Secondary | ICD-10-CM | POA: Diagnosis not present

## 2021-11-01 DIAGNOSIS — M25642 Stiffness of left hand, not elsewhere classified: Secondary | ICD-10-CM | POA: Diagnosis not present

## 2021-11-01 DIAGNOSIS — R202 Paresthesia of skin: Secondary | ICD-10-CM | POA: Diagnosis not present

## 2021-11-01 DIAGNOSIS — Z4789 Encounter for other orthopedic aftercare: Secondary | ICD-10-CM | POA: Diagnosis not present

## 2021-11-01 DIAGNOSIS — R531 Weakness: Secondary | ICD-10-CM | POA: Diagnosis not present

## 2021-11-01 DIAGNOSIS — M25442 Effusion, left hand: Secondary | ICD-10-CM | POA: Diagnosis not present

## 2021-11-04 DIAGNOSIS — R202 Paresthesia of skin: Secondary | ICD-10-CM | POA: Diagnosis not present

## 2021-11-04 DIAGNOSIS — M25442 Effusion, left hand: Secondary | ICD-10-CM | POA: Diagnosis not present

## 2021-11-04 DIAGNOSIS — R531 Weakness: Secondary | ICD-10-CM | POA: Diagnosis not present

## 2021-11-04 DIAGNOSIS — M79642 Pain in left hand: Secondary | ICD-10-CM | POA: Diagnosis not present

## 2021-11-04 DIAGNOSIS — M25642 Stiffness of left hand, not elsewhere classified: Secondary | ICD-10-CM | POA: Diagnosis not present

## 2021-11-04 DIAGNOSIS — Z4789 Encounter for other orthopedic aftercare: Secondary | ICD-10-CM | POA: Diagnosis not present

## 2021-11-11 DIAGNOSIS — R413 Other amnesia: Secondary | ICD-10-CM | POA: Diagnosis not present

## 2021-11-11 DIAGNOSIS — F339 Major depressive disorder, recurrent, unspecified: Secondary | ICD-10-CM | POA: Diagnosis not present

## 2021-11-11 DIAGNOSIS — Z6824 Body mass index (BMI) 24.0-24.9, adult: Secondary | ICD-10-CM | POA: Diagnosis not present

## 2021-11-11 DIAGNOSIS — F419 Anxiety disorder, unspecified: Secondary | ICD-10-CM | POA: Diagnosis not present

## 2021-11-19 DIAGNOSIS — R202 Paresthesia of skin: Secondary | ICD-10-CM | POA: Diagnosis not present

## 2021-11-19 DIAGNOSIS — D692 Other nonthrombocytopenic purpura: Secondary | ICD-10-CM | POA: Diagnosis not present

## 2021-11-19 DIAGNOSIS — R531 Weakness: Secondary | ICD-10-CM | POA: Diagnosis not present

## 2021-11-19 DIAGNOSIS — M25642 Stiffness of left hand, not elsewhere classified: Secondary | ICD-10-CM | POA: Diagnosis not present

## 2021-11-19 DIAGNOSIS — M25442 Effusion, left hand: Secondary | ICD-10-CM | POA: Diagnosis not present

## 2021-11-19 DIAGNOSIS — Z4789 Encounter for other orthopedic aftercare: Secondary | ICD-10-CM | POA: Diagnosis not present

## 2021-11-19 DIAGNOSIS — E782 Mixed hyperlipidemia: Secondary | ICD-10-CM | POA: Diagnosis not present

## 2021-11-19 DIAGNOSIS — M79642 Pain in left hand: Secondary | ICD-10-CM | POA: Diagnosis not present

## 2021-11-28 DIAGNOSIS — N3090 Cystitis, unspecified without hematuria: Secondary | ICD-10-CM | POA: Diagnosis not present

## 2021-11-28 DIAGNOSIS — N3281 Overactive bladder: Secondary | ICD-10-CM | POA: Diagnosis not present

## 2021-11-28 DIAGNOSIS — N952 Postmenopausal atrophic vaginitis: Secondary | ICD-10-CM | POA: Diagnosis not present

## 2021-12-02 DIAGNOSIS — N3000 Acute cystitis without hematuria: Secondary | ICD-10-CM | POA: Diagnosis not present

## 2021-12-02 DIAGNOSIS — R300 Dysuria: Secondary | ICD-10-CM | POA: Diagnosis not present

## 2021-12-09 ENCOUNTER — Ambulatory Visit
Admission: RE | Admit: 2021-12-09 | Discharge: 2021-12-09 | Disposition: A | Discharge status: Home / Self Care | Payer: Medicare HMO | Source: Ambulatory Visit | Attending: Family Medicine | Admitting: Family Medicine

## 2021-12-09 DIAGNOSIS — Z1231 Encounter for screening mammogram for malignant neoplasm of breast: Secondary | ICD-10-CM

## 2021-12-20 DIAGNOSIS — E782 Mixed hyperlipidemia: Secondary | ICD-10-CM | POA: Diagnosis not present

## 2021-12-20 DIAGNOSIS — M810 Age-related osteoporosis without current pathological fracture: Secondary | ICD-10-CM | POA: Diagnosis not present

## 2021-12-20 DIAGNOSIS — E2839 Other primary ovarian failure: Secondary | ICD-10-CM | POA: Diagnosis not present

## 2021-12-20 DIAGNOSIS — F419 Anxiety disorder, unspecified: Secondary | ICD-10-CM | POA: Diagnosis not present

## 2022-01-08 DIAGNOSIS — H353131 Nonexudative age-related macular degeneration, bilateral, early dry stage: Secondary | ICD-10-CM | POA: Diagnosis not present

## 2022-01-08 DIAGNOSIS — H47011 Ischemic optic neuropathy, right eye: Secondary | ICD-10-CM | POA: Diagnosis not present

## 2022-01-08 DIAGNOSIS — H35372 Puckering of macula, left eye: Secondary | ICD-10-CM | POA: Diagnosis not present

## 2022-01-14 DIAGNOSIS — B370 Candidal stomatitis: Secondary | ICD-10-CM | POA: Diagnosis not present

## 2022-01-14 DIAGNOSIS — J449 Chronic obstructive pulmonary disease, unspecified: Secondary | ICD-10-CM | POA: Diagnosis not present

## 2022-01-14 DIAGNOSIS — Z1231 Encounter for screening mammogram for malignant neoplasm of breast: Secondary | ICD-10-CM | POA: Diagnosis not present

## 2022-01-14 DIAGNOSIS — Z20822 Contact with and (suspected) exposure to covid-19: Secondary | ICD-10-CM | POA: Diagnosis not present

## 2022-01-14 DIAGNOSIS — Z6824 Body mass index (BMI) 24.0-24.9, adult: Secondary | ICD-10-CM | POA: Diagnosis not present

## 2022-01-14 DIAGNOSIS — F339 Major depressive disorder, recurrent, unspecified: Secondary | ICD-10-CM | POA: Diagnosis not present

## 2022-01-14 DIAGNOSIS — R059 Cough, unspecified: Secondary | ICD-10-CM | POA: Diagnosis not present

## 2022-01-19 DIAGNOSIS — E782 Mixed hyperlipidemia: Secondary | ICD-10-CM | POA: Diagnosis not present

## 2022-01-19 DIAGNOSIS — F419 Anxiety disorder, unspecified: Secondary | ICD-10-CM | POA: Diagnosis not present

## 2022-02-19 DIAGNOSIS — F419 Anxiety disorder, unspecified: Secondary | ICD-10-CM | POA: Diagnosis not present

## 2022-02-19 DIAGNOSIS — E782 Mixed hyperlipidemia: Secondary | ICD-10-CM | POA: Diagnosis not present

## 2022-04-01 DIAGNOSIS — D485 Neoplasm of uncertain behavior of skin: Secondary | ICD-10-CM | POA: Diagnosis not present

## 2022-04-01 DIAGNOSIS — L821 Other seborrheic keratosis: Secondary | ICD-10-CM | POA: Diagnosis not present

## 2022-04-01 DIAGNOSIS — L570 Actinic keratosis: Secondary | ICD-10-CM | POA: Diagnosis not present

## 2022-04-01 DIAGNOSIS — C44729 Squamous cell carcinoma of skin of left lower limb, including hip: Secondary | ICD-10-CM | POA: Diagnosis not present

## 2022-04-01 DIAGNOSIS — L814 Other melanin hyperpigmentation: Secondary | ICD-10-CM | POA: Diagnosis not present

## 2022-04-01 DIAGNOSIS — L578 Other skin changes due to chronic exposure to nonionizing radiation: Secondary | ICD-10-CM | POA: Diagnosis not present

## 2022-04-01 DIAGNOSIS — D225 Melanocytic nevi of trunk: Secondary | ICD-10-CM | POA: Diagnosis not present

## 2022-04-06 ENCOUNTER — Other Ambulatory Visit: Payer: Self-pay | Admitting: Gastroenterology

## 2022-04-08 DIAGNOSIS — L03116 Cellulitis of left lower limb: Secondary | ICD-10-CM | POA: Diagnosis not present

## 2022-04-15 ENCOUNTER — Other Ambulatory Visit: Payer: Self-pay

## 2022-04-15 MED ORDER — OMEPRAZOLE 20 MG PO CPDR: 20.0000 mg | DELAYED_RELEASE_CAPSULE | Freq: Two times a day (BID) | ORAL | 0 refills | Status: AC

## 2022-04-21 DIAGNOSIS — F339 Major depressive disorder, recurrent, unspecified: Secondary | ICD-10-CM | POA: Diagnosis not present

## 2022-04-21 DIAGNOSIS — F419 Anxiety disorder, unspecified: Secondary | ICD-10-CM | POA: Diagnosis not present

## 2022-04-22 DIAGNOSIS — E782 Mixed hyperlipidemia: Secondary | ICD-10-CM | POA: Diagnosis not present

## 2022-04-28 DIAGNOSIS — Z1331 Encounter for screening for depression: Secondary | ICD-10-CM | POA: Diagnosis not present

## 2022-04-28 DIAGNOSIS — K2100 Gastro-esophageal reflux disease with esophagitis, without bleeding: Secondary | ICD-10-CM | POA: Diagnosis not present

## 2022-04-28 DIAGNOSIS — E782 Mixed hyperlipidemia: Secondary | ICD-10-CM | POA: Diagnosis not present

## 2022-04-28 DIAGNOSIS — Z6824 Body mass index (BMI) 24.0-24.9, adult: Secondary | ICD-10-CM | POA: Diagnosis not present

## 2022-04-28 DIAGNOSIS — Z9181 History of falling: Secondary | ICD-10-CM | POA: Diagnosis not present

## 2022-06-04 DIAGNOSIS — N952 Postmenopausal atrophic vaginitis: Secondary | ICD-10-CM | POA: Diagnosis not present

## 2022-06-04 DIAGNOSIS — N3090 Cystitis, unspecified without hematuria: Secondary | ICD-10-CM | POA: Diagnosis not present

## 2022-06-04 DIAGNOSIS — N3281 Overactive bladder: Secondary | ICD-10-CM | POA: Diagnosis not present

## 2022-06-26 DIAGNOSIS — Z86007 Personal history of in-situ neoplasm of skin: Secondary | ICD-10-CM | POA: Diagnosis not present

## 2022-06-26 DIAGNOSIS — L814 Other melanin hyperpigmentation: Secondary | ICD-10-CM | POA: Diagnosis not present

## 2022-06-26 DIAGNOSIS — L244 Irritant contact dermatitis due to drugs in contact with skin: Secondary | ICD-10-CM | POA: Diagnosis not present

## 2022-06-26 DIAGNOSIS — Z08 Encounter for follow-up examination after completed treatment for malignant neoplasm: Secondary | ICD-10-CM | POA: Diagnosis not present

## 2022-06-26 DIAGNOSIS — L2089 Other atopic dermatitis: Secondary | ICD-10-CM | POA: Diagnosis not present

## 2022-06-26 DIAGNOSIS — L570 Actinic keratosis: Secondary | ICD-10-CM | POA: Diagnosis not present

## 2022-07-07 DIAGNOSIS — H47011 Ischemic optic neuropathy, right eye: Secondary | ICD-10-CM | POA: Diagnosis not present

## 2022-07-07 DIAGNOSIS — H524 Presbyopia: Secondary | ICD-10-CM | POA: Diagnosis not present

## 2022-07-07 DIAGNOSIS — H4720 Unspecified optic atrophy: Secondary | ICD-10-CM | POA: Diagnosis not present

## 2022-07-07 DIAGNOSIS — Z9849 Cataract extraction status, unspecified eye: Secondary | ICD-10-CM | POA: Diagnosis not present

## 2022-07-07 DIAGNOSIS — H5203 Hypermetropia, bilateral: Secondary | ICD-10-CM | POA: Diagnosis not present

## 2022-07-07 DIAGNOSIS — H52223 Regular astigmatism, bilateral: Secondary | ICD-10-CM | POA: Diagnosis not present

## 2022-07-07 DIAGNOSIS — H35373 Puckering of macula, bilateral: Secondary | ICD-10-CM | POA: Diagnosis not present

## 2022-07-07 DIAGNOSIS — Z961 Presence of intraocular lens: Secondary | ICD-10-CM | POA: Diagnosis not present

## 2022-07-07 DIAGNOSIS — H353131 Nonexudative age-related macular degeneration, bilateral, early dry stage: Secondary | ICD-10-CM | POA: Diagnosis not present

## 2022-07-16 DIAGNOSIS — M2012 Hallux valgus (acquired), left foot: Secondary | ICD-10-CM | POA: Diagnosis not present

## 2022-07-16 DIAGNOSIS — M7741 Metatarsalgia, right foot: Secondary | ICD-10-CM | POA: Diagnosis not present

## 2022-07-16 DIAGNOSIS — M2011 Hallux valgus (acquired), right foot: Secondary | ICD-10-CM | POA: Diagnosis not present

## 2022-07-16 DIAGNOSIS — M7742 Metatarsalgia, left foot: Secondary | ICD-10-CM | POA: Diagnosis not present

## 2022-07-17 DIAGNOSIS — H47011 Ischemic optic neuropathy, right eye: Secondary | ICD-10-CM | POA: Diagnosis not present

## 2022-07-17 DIAGNOSIS — H04123 Dry eye syndrome of bilateral lacrimal glands: Secondary | ICD-10-CM | POA: Diagnosis not present

## 2022-07-17 DIAGNOSIS — H353132 Nonexudative age-related macular degeneration, bilateral, intermediate dry stage: Secondary | ICD-10-CM | POA: Diagnosis not present

## 2022-07-21 DIAGNOSIS — F419 Anxiety disorder, unspecified: Secondary | ICD-10-CM | POA: Diagnosis not present

## 2022-07-21 DIAGNOSIS — F339 Major depressive disorder, recurrent, unspecified: Secondary | ICD-10-CM | POA: Diagnosis not present

## 2022-07-27 DIAGNOSIS — M791 Myalgia, unspecified site: Secondary | ICD-10-CM | POA: Diagnosis not present

## 2022-07-27 DIAGNOSIS — J0110 Acute frontal sinusitis, unspecified: Secondary | ICD-10-CM | POA: Diagnosis not present

## 2022-07-27 DIAGNOSIS — R0981 Nasal congestion: Secondary | ICD-10-CM | POA: Diagnosis not present

## 2022-08-20 DIAGNOSIS — F419 Anxiety disorder, unspecified: Secondary | ICD-10-CM | POA: Diagnosis not present

## 2022-08-20 DIAGNOSIS — F339 Major depressive disorder, recurrent, unspecified: Secondary | ICD-10-CM | POA: Diagnosis not present

## 2022-09-20 DIAGNOSIS — F339 Major depressive disorder, recurrent, unspecified: Secondary | ICD-10-CM | POA: Diagnosis not present

## 2022-09-20 DIAGNOSIS — F419 Anxiety disorder, unspecified: Secondary | ICD-10-CM | POA: Diagnosis not present

## 2022-09-30 DIAGNOSIS — L820 Inflamed seborrheic keratosis: Secondary | ICD-10-CM | POA: Diagnosis not present

## 2022-09-30 DIAGNOSIS — L244 Irritant contact dermatitis due to drugs in contact with skin: Secondary | ICD-10-CM | POA: Diagnosis not present

## 2022-09-30 DIAGNOSIS — Z09 Encounter for follow-up examination after completed treatment for conditions other than malignant neoplasm: Secondary | ICD-10-CM | POA: Diagnosis not present

## 2022-09-30 DIAGNOSIS — L570 Actinic keratosis: Secondary | ICD-10-CM | POA: Diagnosis not present

## 2022-09-30 DIAGNOSIS — L578 Other skin changes due to chronic exposure to nonionizing radiation: Secondary | ICD-10-CM | POA: Diagnosis not present

## 2022-10-06 DIAGNOSIS — E782 Mixed hyperlipidemia: Secondary | ICD-10-CM | POA: Diagnosis not present

## 2022-10-13 DIAGNOSIS — Z6824 Body mass index (BMI) 24.0-24.9, adult: Secondary | ICD-10-CM | POA: Diagnosis not present

## 2022-10-13 DIAGNOSIS — E782 Mixed hyperlipidemia: Secondary | ICD-10-CM | POA: Diagnosis not present

## 2022-10-13 DIAGNOSIS — D692 Other nonthrombocytopenic purpura: Secondary | ICD-10-CM | POA: Diagnosis not present

## 2022-10-13 DIAGNOSIS — F339 Major depressive disorder, recurrent, unspecified: Secondary | ICD-10-CM | POA: Diagnosis not present

## 2022-10-13 DIAGNOSIS — J449 Chronic obstructive pulmonary disease, unspecified: Secondary | ICD-10-CM | POA: Diagnosis not present

## 2022-10-20 DIAGNOSIS — E782 Mixed hyperlipidemia: Secondary | ICD-10-CM | POA: Diagnosis not present

## 2022-10-20 DIAGNOSIS — F339 Major depressive disorder, recurrent, unspecified: Secondary | ICD-10-CM | POA: Diagnosis not present

## 2022-10-21 DIAGNOSIS — M810 Age-related osteoporosis without current pathological fracture: Secondary | ICD-10-CM | POA: Diagnosis not present

## 2022-11-11 DIAGNOSIS — Z139 Encounter for screening, unspecified: Secondary | ICD-10-CM | POA: Diagnosis not present

## 2022-11-11 DIAGNOSIS — Z0000 Encounter for general adult medical examination without abnormal findings: Secondary | ICD-10-CM | POA: Diagnosis not present

## 2022-11-11 DIAGNOSIS — Z6824 Body mass index (BMI) 24.0-24.9, adult: Secondary | ICD-10-CM | POA: Diagnosis not present

## 2022-11-11 DIAGNOSIS — Z1331 Encounter for screening for depression: Secondary | ICD-10-CM | POA: Diagnosis not present

## 2022-11-11 DIAGNOSIS — Z1339 Encounter for screening examination for other mental health and behavioral disorders: Secondary | ICD-10-CM | POA: Diagnosis not present

## 2022-11-20 DIAGNOSIS — F339 Major depressive disorder, recurrent, unspecified: Secondary | ICD-10-CM | POA: Diagnosis not present

## 2022-11-20 DIAGNOSIS — E669 Obesity, unspecified: Secondary | ICD-10-CM | POA: Diagnosis not present

## 2022-11-28 ENCOUNTER — Other Ambulatory Visit: Payer: Self-pay | Admitting: Family Medicine

## 2022-11-28 DIAGNOSIS — Z1231 Encounter for screening mammogram for malignant neoplasm of breast: Secondary | ICD-10-CM

## 2022-12-04 DIAGNOSIS — N3090 Cystitis, unspecified without hematuria: Secondary | ICD-10-CM | POA: Diagnosis not present

## 2022-12-04 DIAGNOSIS — N952 Postmenopausal atrophic vaginitis: Secondary | ICD-10-CM | POA: Diagnosis not present

## 2022-12-04 DIAGNOSIS — N3281 Overactive bladder: Secondary | ICD-10-CM | POA: Diagnosis not present

## 2022-12-09 DIAGNOSIS — R5381 Other malaise: Secondary | ICD-10-CM | POA: Diagnosis not present

## 2022-12-09 DIAGNOSIS — R5383 Other fatigue: Secondary | ICD-10-CM | POA: Diagnosis not present

## 2022-12-21 DIAGNOSIS — F339 Major depressive disorder, recurrent, unspecified: Secondary | ICD-10-CM | POA: Diagnosis not present

## 2022-12-21 DIAGNOSIS — E669 Obesity, unspecified: Secondary | ICD-10-CM | POA: Diagnosis not present

## 2022-12-31 DIAGNOSIS — U071 COVID-19: Secondary | ICD-10-CM | POA: Diagnosis not present

## 2022-12-31 DIAGNOSIS — Z20822 Contact with and (suspected) exposure to covid-19: Secondary | ICD-10-CM | POA: Diagnosis not present

## 2022-12-31 DIAGNOSIS — J029 Acute pharyngitis, unspecified: Secondary | ICD-10-CM | POA: Diagnosis not present

## 2023-01-07 ENCOUNTER — Ambulatory Visit
Admission: RE | Admit: 2023-01-07 | Discharge: 2023-01-07 | Disposition: A | Discharge status: Home / Self Care | Payer: Medicare HMO | Source: Ambulatory Visit | Attending: Family Medicine | Admitting: Family Medicine

## 2023-01-07 DIAGNOSIS — Z1231 Encounter for screening mammogram for malignant neoplasm of breast: Secondary | ICD-10-CM | POA: Diagnosis not present

## 2023-01-20 DIAGNOSIS — E669 Obesity, unspecified: Secondary | ICD-10-CM | POA: Diagnosis not present

## 2023-01-20 DIAGNOSIS — R5383 Other fatigue: Secondary | ICD-10-CM | POA: Diagnosis not present

## 2023-01-20 DIAGNOSIS — F339 Major depressive disorder, recurrent, unspecified: Secondary | ICD-10-CM | POA: Diagnosis not present

## 2023-01-20 DIAGNOSIS — Z1231 Encounter for screening mammogram for malignant neoplasm of breast: Secondary | ICD-10-CM | POA: Diagnosis not present

## 2023-01-20 DIAGNOSIS — Z23 Encounter for immunization: Secondary | ICD-10-CM | POA: Diagnosis not present

## 2023-01-20 DIAGNOSIS — J449 Chronic obstructive pulmonary disease, unspecified: Secondary | ICD-10-CM | POA: Diagnosis not present

## 2023-01-20 DIAGNOSIS — Z6823 Body mass index (BMI) 23.0-23.9, adult: Secondary | ICD-10-CM | POA: Diagnosis not present

## 2023-02-13 DIAGNOSIS — D485 Neoplasm of uncertain behavior of skin: Secondary | ICD-10-CM | POA: Diagnosis not present

## 2023-02-13 DIAGNOSIS — C44319 Basal cell carcinoma of skin of other parts of face: Secondary | ICD-10-CM | POA: Diagnosis not present

## 2023-02-14 DIAGNOSIS — R300 Dysuria: Secondary | ICD-10-CM | POA: Diagnosis not present

## 2023-02-14 DIAGNOSIS — N3001 Acute cystitis with hematuria: Secondary | ICD-10-CM | POA: Diagnosis not present

## 2023-02-19 DIAGNOSIS — R5383 Other fatigue: Secondary | ICD-10-CM | POA: Diagnosis not present

## 2023-02-19 DIAGNOSIS — Z23 Encounter for immunization: Secondary | ICD-10-CM | POA: Diagnosis not present

## 2023-02-19 DIAGNOSIS — Z6823 Body mass index (BMI) 23.0-23.9, adult: Secondary | ICD-10-CM | POA: Diagnosis not present

## 2023-02-20 DIAGNOSIS — E669 Obesity, unspecified: Secondary | ICD-10-CM | POA: Diagnosis not present

## 2023-02-20 DIAGNOSIS — F339 Major depressive disorder, recurrent, unspecified: Secondary | ICD-10-CM | POA: Diagnosis not present

## 2023-03-22 DIAGNOSIS — E669 Obesity, unspecified: Secondary | ICD-10-CM | POA: Diagnosis not present

## 2023-03-22 DIAGNOSIS — F339 Major depressive disorder, recurrent, unspecified: Secondary | ICD-10-CM | POA: Diagnosis not present

## 2023-03-23 DIAGNOSIS — E782 Mixed hyperlipidemia: Secondary | ICD-10-CM | POA: Diagnosis not present

## 2023-03-30 DIAGNOSIS — F339 Major depressive disorder, recurrent, unspecified: Secondary | ICD-10-CM | POA: Diagnosis not present

## 2023-03-30 DIAGNOSIS — E782 Mixed hyperlipidemia: Secondary | ICD-10-CM | POA: Diagnosis not present

## 2023-03-30 DIAGNOSIS — Z6823 Body mass index (BMI) 23.0-23.9, adult: Secondary | ICD-10-CM | POA: Diagnosis not present

## 2023-04-07 DIAGNOSIS — L578 Other skin changes due to chronic exposure to nonionizing radiation: Secondary | ICD-10-CM | POA: Diagnosis not present

## 2023-04-07 DIAGNOSIS — L814 Other melanin hyperpigmentation: Secondary | ICD-10-CM | POA: Diagnosis not present

## 2023-04-07 DIAGNOSIS — L570 Actinic keratosis: Secondary | ICD-10-CM | POA: Diagnosis not present

## 2023-04-07 DIAGNOSIS — L821 Other seborrheic keratosis: Secondary | ICD-10-CM | POA: Diagnosis not present

## 2023-04-07 DIAGNOSIS — Z08 Encounter for follow-up examination after completed treatment for malignant neoplasm: Secondary | ICD-10-CM | POA: Diagnosis not present

## 2023-04-07 DIAGNOSIS — Z86007 Personal history of in-situ neoplasm of skin: Secondary | ICD-10-CM | POA: Diagnosis not present

## 2023-04-07 DIAGNOSIS — D225 Melanocytic nevi of trunk: Secondary | ICD-10-CM | POA: Diagnosis not present

## 2023-04-21 DIAGNOSIS — C44311 Basal cell carcinoma of skin of nose: Secondary | ICD-10-CM | POA: Diagnosis not present

## 2023-05-12 DIAGNOSIS — H353132 Nonexudative age-related macular degeneration, bilateral, intermediate dry stage: Secondary | ICD-10-CM | POA: Diagnosis not present

## 2023-07-16 DIAGNOSIS — J449 Chronic obstructive pulmonary disease, unspecified: Secondary | ICD-10-CM | POA: Diagnosis not present

## 2023-07-16 DIAGNOSIS — F339 Major depressive disorder, recurrent, unspecified: Secondary | ICD-10-CM | POA: Diagnosis not present

## 2023-07-16 DIAGNOSIS — Z6823 Body mass index (BMI) 23.0-23.9, adult: Secondary | ICD-10-CM | POA: Diagnosis not present

## 2023-07-16 DIAGNOSIS — R413 Other amnesia: Secondary | ICD-10-CM | POA: Diagnosis not present

## 2023-07-23 DIAGNOSIS — H353132 Nonexudative age-related macular degeneration, bilateral, intermediate dry stage: Secondary | ICD-10-CM | POA: Diagnosis not present

## 2023-07-28 DIAGNOSIS — D692 Other nonthrombocytopenic purpura: Secondary | ICD-10-CM | POA: Diagnosis not present

## 2023-07-28 DIAGNOSIS — Z6823 Body mass index (BMI) 23.0-23.9, adult: Secondary | ICD-10-CM | POA: Diagnosis not present

## 2023-07-28 DIAGNOSIS — R4189 Other symptoms and signs involving cognitive functions and awareness: Secondary | ICD-10-CM | POA: Diagnosis not present

## 2023-08-06 DIAGNOSIS — Z85828 Personal history of other malignant neoplasm of skin: Secondary | ICD-10-CM | POA: Diagnosis not present

## 2023-08-06 DIAGNOSIS — J449 Chronic obstructive pulmonary disease, unspecified: Secondary | ICD-10-CM | POA: Diagnosis not present

## 2023-08-06 DIAGNOSIS — L814 Other melanin hyperpigmentation: Secondary | ICD-10-CM | POA: Diagnosis not present

## 2023-08-06 DIAGNOSIS — Z09 Encounter for follow-up examination after completed treatment for conditions other than malignant neoplasm: Secondary | ICD-10-CM | POA: Diagnosis not present

## 2023-08-06 DIAGNOSIS — Z08 Encounter for follow-up examination after completed treatment for malignant neoplasm: Secondary | ICD-10-CM | POA: Diagnosis not present

## 2023-08-06 DIAGNOSIS — L820 Inflamed seborrheic keratosis: Secondary | ICD-10-CM | POA: Diagnosis not present

## 2023-08-06 DIAGNOSIS — L570 Actinic keratosis: Secondary | ICD-10-CM | POA: Diagnosis not present

## 2023-08-06 DIAGNOSIS — Z6823 Body mass index (BMI) 23.0-23.9, adult: Secondary | ICD-10-CM | POA: Diagnosis not present

## 2023-08-06 DIAGNOSIS — L821 Other seborrheic keratosis: Secondary | ICD-10-CM | POA: Diagnosis not present

## 2023-08-06 DIAGNOSIS — Z23 Encounter for immunization: Secondary | ICD-10-CM | POA: Diagnosis not present

## 2023-08-06 DIAGNOSIS — L538 Other specified erythematous conditions: Secondary | ICD-10-CM | POA: Diagnosis not present

## 2023-08-10 DIAGNOSIS — H47011 Ischemic optic neuropathy, right eye: Secondary | ICD-10-CM | POA: Diagnosis not present

## 2023-08-12 DIAGNOSIS — Z6823 Body mass index (BMI) 23.0-23.9, adult: Secondary | ICD-10-CM | POA: Diagnosis not present

## 2023-08-12 DIAGNOSIS — F02A0 Dementia in other diseases classified elsewhere, mild, without behavioral disturbance, psychotic disturbance, mood disturbance, and anxiety: Secondary | ICD-10-CM | POA: Diagnosis not present

## 2023-08-12 DIAGNOSIS — G309 Alzheimer's disease, unspecified: Secondary | ICD-10-CM | POA: Diagnosis not present

## 2023-09-10 DIAGNOSIS — L03116 Cellulitis of left lower limb: Secondary | ICD-10-CM | POA: Diagnosis not present

## 2023-09-22 DIAGNOSIS — E782 Mixed hyperlipidemia: Secondary | ICD-10-CM | POA: Diagnosis not present

## 2023-09-29 DIAGNOSIS — H02885 Meibomian gland dysfunction left lower eyelid: Secondary | ICD-10-CM | POA: Diagnosis not present

## 2023-09-29 DIAGNOSIS — F02A0 Dementia in other diseases classified elsewhere, mild, without behavioral disturbance, psychotic disturbance, mood disturbance, and anxiety: Secondary | ICD-10-CM | POA: Diagnosis not present

## 2023-09-29 DIAGNOSIS — H1013 Acute atopic conjunctivitis, bilateral: Secondary | ICD-10-CM | POA: Diagnosis not present

## 2023-09-29 DIAGNOSIS — Z6823 Body mass index (BMI) 23.0-23.9, adult: Secondary | ICD-10-CM | POA: Diagnosis not present

## 2023-09-29 DIAGNOSIS — G309 Alzheimer's disease, unspecified: Secondary | ICD-10-CM | POA: Diagnosis not present

## 2023-09-29 DIAGNOSIS — H02882 Meibomian gland dysfunction right lower eyelid: Secondary | ICD-10-CM | POA: Diagnosis not present

## 2023-09-29 DIAGNOSIS — R300 Dysuria: Secondary | ICD-10-CM | POA: Diagnosis not present

## 2023-09-29 DIAGNOSIS — E782 Mixed hyperlipidemia: Secondary | ICD-10-CM | POA: Diagnosis not present

## 2023-11-24 DIAGNOSIS — H353132 Nonexudative age-related macular degeneration, bilateral, intermediate dry stage: Secondary | ICD-10-CM | POA: Diagnosis not present

## 2023-12-01 DIAGNOSIS — G309 Alzheimer's disease, unspecified: Secondary | ICD-10-CM | POA: Diagnosis not present

## 2023-12-01 DIAGNOSIS — Z6823 Body mass index (BMI) 23.0-23.9, adult: Secondary | ICD-10-CM | POA: Diagnosis not present

## 2023-12-01 DIAGNOSIS — F02A0 Dementia in other diseases classified elsewhere, mild, without behavioral disturbance, psychotic disturbance, mood disturbance, and anxiety: Secondary | ICD-10-CM | POA: Diagnosis not present

## 2023-12-16 DIAGNOSIS — Z6822 Body mass index (BMI) 22.0-22.9, adult: Secondary | ICD-10-CM | POA: Diagnosis not present

## 2023-12-16 DIAGNOSIS — M7062 Trochanteric bursitis, left hip: Secondary | ICD-10-CM | POA: Diagnosis not present

## 2023-12-31 DIAGNOSIS — J069 Acute upper respiratory infection, unspecified: Secondary | ICD-10-CM | POA: Diagnosis not present

## 2023-12-31 DIAGNOSIS — R059 Cough, unspecified: Secondary | ICD-10-CM | POA: Diagnosis not present

## 2023-12-31 DIAGNOSIS — Z6823 Body mass index (BMI) 23.0-23.9, adult: Secondary | ICD-10-CM | POA: Diagnosis not present

## 2024-01-06 DIAGNOSIS — G309 Alzheimer's disease, unspecified: Secondary | ICD-10-CM | POA: Diagnosis not present

## 2024-01-06 DIAGNOSIS — S8011XA Contusion of right lower leg, initial encounter: Secondary | ICD-10-CM | POA: Diagnosis not present

## 2024-01-06 DIAGNOSIS — Z6823 Body mass index (BMI) 23.0-23.9, adult: Secondary | ICD-10-CM | POA: Diagnosis not present

## 2024-01-06 DIAGNOSIS — R410 Disorientation, unspecified: Secondary | ICD-10-CM | POA: Diagnosis not present

## 2024-01-06 DIAGNOSIS — F02A0 Dementia in other diseases classified elsewhere, mild, without behavioral disturbance, psychotic disturbance, mood disturbance, and anxiety: Secondary | ICD-10-CM | POA: Diagnosis not present

## 2024-01-19 DIAGNOSIS — Z23 Encounter for immunization: Secondary | ICD-10-CM | POA: Diagnosis not present

## 2024-02-02 DIAGNOSIS — Z6822 Body mass index (BMI) 22.0-22.9, adult: Secondary | ICD-10-CM | POA: Diagnosis not present

## 2024-02-02 DIAGNOSIS — G309 Alzheimer's disease, unspecified: Secondary | ICD-10-CM | POA: Diagnosis not present

## 2024-02-02 DIAGNOSIS — J449 Chronic obstructive pulmonary disease, unspecified: Secondary | ICD-10-CM | POA: Diagnosis not present

## 2024-02-02 DIAGNOSIS — F339 Major depressive disorder, recurrent, unspecified: Secondary | ICD-10-CM | POA: Diagnosis not present

## 2024-02-02 DIAGNOSIS — F02A0 Dementia in other diseases classified elsewhere, mild, without behavioral disturbance, psychotic disturbance, mood disturbance, and anxiety: Secondary | ICD-10-CM | POA: Diagnosis not present

## 2024-03-25 DIAGNOSIS — N3281 Overactive bladder: Secondary | ICD-10-CM | POA: Diagnosis not present

## 2024-03-25 DIAGNOSIS — N952 Postmenopausal atrophic vaginitis: Secondary | ICD-10-CM | POA: Diagnosis not present

## 2024-03-25 DIAGNOSIS — N3090 Cystitis, unspecified without hematuria: Secondary | ICD-10-CM | POA: Diagnosis not present

## 2024-03-30 DIAGNOSIS — R051 Acute cough: Secondary | ICD-10-CM | POA: Diagnosis not present

## 2024-03-31 DIAGNOSIS — E782 Mixed hyperlipidemia: Secondary | ICD-10-CM | POA: Diagnosis not present

## 2024-04-05 ENCOUNTER — Other Ambulatory Visit (HOSPITAL_BASED_OUTPATIENT_CLINIC_OR_DEPARTMENT_OTHER): Payer: Self-pay | Admitting: Family Medicine

## 2024-04-05 DIAGNOSIS — Z6823 Body mass index (BMI) 23.0-23.9, adult: Secondary | ICD-10-CM | POA: Diagnosis not present

## 2024-04-05 DIAGNOSIS — G309 Alzheimer's disease, unspecified: Secondary | ICD-10-CM | POA: Diagnosis not present

## 2024-04-05 DIAGNOSIS — E2839 Other primary ovarian failure: Secondary | ICD-10-CM

## 2024-04-05 DIAGNOSIS — M810 Age-related osteoporosis without current pathological fracture: Secondary | ICD-10-CM | POA: Diagnosis not present

## 2024-04-05 DIAGNOSIS — F02A0 Dementia in other diseases classified elsewhere, mild, without behavioral disturbance, psychotic disturbance, mood disturbance, and anxiety: Secondary | ICD-10-CM | POA: Diagnosis not present

## 2024-04-26 ENCOUNTER — Ambulatory Visit (HOSPITAL_BASED_OUTPATIENT_CLINIC_OR_DEPARTMENT_OTHER)
Admission: RE | Admit: 2024-04-26 | Discharge: 2024-04-26 | Disposition: A | Payer: Medicare (Managed Care) | Source: Ambulatory Visit | Attending: Family Medicine | Admitting: Family Medicine

## 2024-04-26 DIAGNOSIS — E2839 Other primary ovarian failure: Secondary | ICD-10-CM | POA: Diagnosis not present
